# Patient Record
Sex: Female | Born: 1950 | Race: Black or African American | Hispanic: No | Marital: Married | Smoking: Never smoker
Health system: Southern US, Community
[De-identification: ages and names within clinical notes are randomized; demographics above are authoritative.]

## PROBLEM LIST (undated history)

## (undated) DIAGNOSIS — M797 Fibromyalgia: Secondary | ICD-10-CM

## (undated) DIAGNOSIS — D126 Benign neoplasm of colon, unspecified: Secondary | ICD-10-CM

## (undated) DIAGNOSIS — K5909 Other constipation: Secondary | ICD-10-CM

## (undated) DIAGNOSIS — I10 Essential (primary) hypertension: Secondary | ICD-10-CM

## (undated) DIAGNOSIS — I499 Cardiac arrhythmia, unspecified: Secondary | ICD-10-CM

## (undated) DIAGNOSIS — K760 Fatty (change of) liver, not elsewhere classified: Secondary | ICD-10-CM

## (undated) DIAGNOSIS — K219 Gastro-esophageal reflux disease without esophagitis: Secondary | ICD-10-CM

## (undated) DIAGNOSIS — Z8719 Personal history of other diseases of the digestive system: Secondary | ICD-10-CM

## (undated) HISTORY — PX: BREAST SURGERY: SHX581

## (undated) HISTORY — PX: ABDOMINAL HYSTERECTOMY: SHX81

## (undated) HISTORY — PX: OTHER SURGICAL HISTORY: SHX169

---

## 2015-05-04 ENCOUNTER — Other Ambulatory Visit: Payer: Self-pay | Admitting: Gastroenterology

## 2015-05-04 DIAGNOSIS — R109 Unspecified abdominal pain: Secondary | ICD-10-CM

## 2015-05-04 DIAGNOSIS — K5909 Other constipation: Secondary | ICD-10-CM

## 2015-05-04 DIAGNOSIS — G8929 Other chronic pain: Secondary | ICD-10-CM

## 2015-05-10 ENCOUNTER — Ambulatory Visit
Admission: RE | Admit: 2015-05-10 | Discharge: 2015-05-10 | Disposition: A | Discharge status: Home / Self Care | Payer: Medicare Other | Source: Ambulatory Visit | Attending: Gastroenterology | Admitting: Gastroenterology

## 2015-05-10 ENCOUNTER — Other Ambulatory Visit: Payer: Self-pay | Admitting: Gastroenterology

## 2015-05-10 DIAGNOSIS — G8929 Other chronic pain: Secondary | ICD-10-CM

## 2015-05-10 DIAGNOSIS — K5909 Other constipation: Secondary | ICD-10-CM

## 2015-05-10 DIAGNOSIS — R109 Unspecified abdominal pain: Secondary | ICD-10-CM | POA: Diagnosis present

## 2015-05-10 DIAGNOSIS — N281 Cyst of kidney, acquired: Secondary | ICD-10-CM | POA: Insufficient documentation

## 2015-05-10 DIAGNOSIS — G9589 Other specified diseases of spinal cord: Secondary | ICD-10-CM | POA: Insufficient documentation

## 2015-05-10 DIAGNOSIS — N200 Calculus of kidney: Secondary | ICD-10-CM | POA: Diagnosis not present

## 2015-05-10 DIAGNOSIS — Z90710 Acquired absence of both cervix and uterus: Secondary | ICD-10-CM | POA: Insufficient documentation

## 2015-05-10 DIAGNOSIS — K429 Umbilical hernia without obstruction or gangrene: Secondary | ICD-10-CM | POA: Diagnosis not present

## 2015-05-10 DIAGNOSIS — K449 Diaphragmatic hernia without obstruction or gangrene: Secondary | ICD-10-CM | POA: Diagnosis not present

## 2015-05-10 DIAGNOSIS — J9811 Atelectasis: Secondary | ICD-10-CM | POA: Insufficient documentation

## 2015-05-10 HISTORY — DX: Essential (primary) hypertension: I10

## 2015-05-10 LAB — POCT I-STAT CREATININE: Creatinine, Ser: 0.8 mg/dL (ref 0.44–1.00)

## 2015-06-08 DIAGNOSIS — R109 Unspecified abdominal pain: Secondary | ICD-10-CM

## 2015-06-08 DIAGNOSIS — K5909 Other constipation: Secondary | ICD-10-CM | POA: Insufficient documentation

## 2015-06-08 DIAGNOSIS — M797 Fibromyalgia: Secondary | ICD-10-CM | POA: Insufficient documentation

## 2015-06-08 DIAGNOSIS — G8929 Other chronic pain: Secondary | ICD-10-CM | POA: Insufficient documentation

## 2015-09-24 ENCOUNTER — Encounter: Payer: Self-pay | Admitting: *Deleted

## 2015-09-27 ENCOUNTER — Encounter
Admission: RE | Disposition: A | Discharge status: Home / Self Care | Payer: Self-pay | Source: Ambulatory Visit | Attending: Gastroenterology

## 2015-09-27 ENCOUNTER — Ambulatory Visit: Payer: Medicare Other | Admitting: Anesthesiology

## 2015-09-27 ENCOUNTER — Ambulatory Visit
Admission: RE | Admit: 2015-09-27 | Discharge: 2015-09-27 | Disposition: A | Discharge status: Home / Self Care | Payer: Medicare Other | Source: Ambulatory Visit | Attending: Gastroenterology | Admitting: Gastroenterology

## 2015-09-27 ENCOUNTER — Encounter: Payer: Self-pay | Admitting: *Deleted

## 2015-09-27 DIAGNOSIS — R1031 Right lower quadrant pain: Secondary | ICD-10-CM | POA: Insufficient documentation

## 2015-09-27 DIAGNOSIS — D124 Benign neoplasm of descending colon: Secondary | ICD-10-CM | POA: Insufficient documentation

## 2015-09-27 DIAGNOSIS — G8929 Other chronic pain: Secondary | ICD-10-CM | POA: Insufficient documentation

## 2015-09-27 DIAGNOSIS — Z79899 Other long term (current) drug therapy: Secondary | ICD-10-CM | POA: Diagnosis not present

## 2015-09-27 DIAGNOSIS — K219 Gastro-esophageal reflux disease without esophagitis: Secondary | ICD-10-CM | POA: Diagnosis not present

## 2015-09-27 DIAGNOSIS — M797 Fibromyalgia: Secondary | ICD-10-CM | POA: Insufficient documentation

## 2015-09-27 DIAGNOSIS — K5909 Other constipation: Secondary | ICD-10-CM | POA: Diagnosis not present

## 2015-09-27 DIAGNOSIS — I10 Essential (primary) hypertension: Secondary | ICD-10-CM | POA: Diagnosis not present

## 2015-09-27 HISTORY — DX: Fatty (change of) liver, not elsewhere classified: K76.0

## 2015-09-27 HISTORY — DX: Gastro-esophageal reflux disease without esophagitis: K21.9

## 2015-09-27 HISTORY — DX: Personal history of other diseases of the digestive system: Z87.19

## 2015-09-27 HISTORY — DX: Fibromyalgia: M79.7

## 2015-09-27 HISTORY — DX: Other constipation: K59.09

## 2015-09-27 HISTORY — PX: COLONOSCOPY WITH PROPOFOL: SHX5780

## 2015-09-27 SURGERY — COLONOSCOPY WITH PROPOFOL
Anesthesia: General

## 2015-09-27 MED ORDER — MIDAZOLAM HCL 2 MG/2ML IJ SOLN
INTRAMUSCULAR | Status: DC | PRN
  Administered 2015-09-27: 1 mg via INTRAVENOUS

## 2015-09-27 MED ORDER — SODIUM CHLORIDE 0.9 % IV SOLN
INTRAVENOUS | Status: DC
  Administered 2015-09-27: 16:00:00 via INTRAVENOUS

## 2015-09-27 MED ORDER — SODIUM CHLORIDE 0.9 % IV SOLN
INTRAVENOUS | Status: DC
  Administered 2015-09-27: 15:00:00 via INTRAVENOUS

## 2015-09-27 MED ORDER — PROPOFOL 500 MG/50ML IV EMUL
INTRAVENOUS | Status: DC | PRN
  Administered 2015-09-27: 80 ug/kg/min via INTRAVENOUS

## 2015-09-27 MED ORDER — PROPOFOL 10 MG/ML IV BOLUS
INTRAVENOUS | Status: DC | PRN
  Administered 2015-09-27: 20 mg via INTRAVENOUS

## 2015-09-27 MED ORDER — FENTANYL CITRATE (PF) 100 MCG/2ML IJ SOLN
INTRAMUSCULAR | Status: DC | PRN
  Administered 2015-09-27: 50 ug via INTRAVENOUS

## 2015-09-27 NOTE — Anesthesia Preprocedure Evaluation (Signed)
Anesthesia Evaluation  Patient identified by MRN, date of birth, ID band Patient awake    Reviewed: Allergy & Precautions, NPO status , Patient's Chart, lab work & pertinent test results  Airway Mallampati: II  TM Distance: >3 FB Neck ROM: Full    Dental no notable dental hx.    Pulmonary neg pulmonary ROS,    Pulmonary exam normal breath sounds clear to auscultation       Cardiovascular hypertension, Pt. on medications Normal cardiovascular exam     Neuro/Psych negative psych ROS   GI/Hepatic Neg liver ROS, hiatal hernia, GERD  Medicated and Controlled,  Endo/Other  negative endocrine ROS  Renal/GU negative Renal ROS     Musculoskeletal  (+) Fibromyalgia -  Abdominal Normal abdominal exam  (+)   Peds  Hematology   Anesthesia Other Findings   Reproductive/Obstetrics                             Anesthesia Physical Anesthesia Plan  ASA: II  Anesthesia Plan: General   Post-op Pain Management:    Induction: Intravenous  Airway Management Planned: Nasal Cannula  Additional Equipment:   Intra-op Plan:   Post-operative Plan:   Informed Consent: I have reviewed the patients History and Physical, chart, labs and discussed the procedure including the risks, benefits and alternatives for the proposed anesthesia with the patient or authorized representative who has indicated his/her understanding and acceptance.   Dental advisory given  Plan Discussed with: CRNA and Surgeon  Anesthesia Plan Comments:         Anesthesia Quick Evaluation

## 2015-09-27 NOTE — Op Note (Signed)
[facility] Gastroenterology Patient Name: Andrea Yang Procedure Date: 09/27/2015 3:56 PM MRN: [id number] Account #: [id number] Date of Birth: 1951/04/22 Admit Type: Outpatient Age: 65 Room: [facility] ENDO ROOM 4 Gender: Female Note Status: Finalized Procedure:         Colonoscopy Indications:       Abdominal pain in the right lower quadrant Providers:         Lupita Dawn. Candace Cruise, MD Medicines:         Monitored Anesthesia Care Complications:     No immediate complications. Procedure:         Pre-Anesthesia Assessment:                    - Prior to the procedure, a History and Physical was                     performed, and patient medications, allergies and                     sensitivities were reviewed. The patient's tolerance of                     previous anesthesia was reviewed.                    - The risks and benefits of the procedure and the sedation                     options and risks were discussed with the patient. All                     questions were answered and informed consent was obtained.                    - After reviewing the risks and benefits, the patient was                     deemed in satisfactory condition to undergo the procedure.                    After obtaining informed consent, the colonoscope was                     passed under direct vision. Throughout the procedure, the                     patient's blood pressure, pulse, and oxygen saturations                     were monitored continuously. The Colonoscope was                     introduced through the anus and advanced to the the cecum,                     identified by appendiceal orifice and ileocecal valve. The                     colonoscopy was performed without difficulty. The patient                     tolerated the procedure well. The quality of the bowel                     preparation was fair.  Findings:      A diminutive polyp was found in the descending colon. The  polyp was       sessile. The polyp was removed with a jumbo cold forceps. Resection and       retrieval were complete.      The exam was otherwise without abnormality. Impression:        - Preparation of the colon was fair.                    - One diminutive polyp in the descending colon, removed                     with a jumbo cold forceps. Resected and retrieved.                    - The examination was otherwise normal. Recommendation:    - Discharge patient to home.                    - The findings and recommendations were discussed with the                     patient.                    - Await pathology results.                    - Repeat colonoscopy in 5 years for surveillance based on                     pathology results. Procedure Code(s): --- Professional ---                    [id number], Colonoscopy, flexible; with biopsy, single or                     multiple Diagnosis Code(s): --- Professional ---                    D12.4, Benign neoplasm of descending colon                    R10.31, Right lower quadrant pain CPT copyright 2016 [employer]. All rights reserved. The codes documented in this report are preliminary and upon coder review may  be revised to meet current compliance requirements. Hulen Luster, MD 09/27/2015 4:18:18 PM This report has been signed electronically. Number of Addenda: 0 Note Initiated On: 09/27/2015 3:56 PM Scope Withdrawal Time: 0 hours 8 minutes 43 seconds  Total Procedure Duration: 0 hours 14 minutes 46 seconds       [facility]

## 2015-09-27 NOTE — H&P (Signed)
  Date of Initial H&P: 09/14/2015  History reviewed, patient examined, no change in status, stable for surgery.

## 2015-09-27 NOTE — Transfer of Care (Signed)
Immediate Anesthesia Transfer of Care Note  Patient: Andrea Yang  Procedure(s) Performed: Procedure(s): COLONOSCOPY WITH PROPOFOL (N/A)  Patient Location: PACU  Anesthesia Type:General  Level of Consciousness: awake, alert  and oriented  Airway & Oxygen Therapy: Patient Spontanous Breathing and Patient connected to nasal cannula oxygen  Post-op Assessment: Report given to RN and Post -op Vital signs reviewed and stable  Post vital signs: Reviewed and stable  Last Vitals:  Filed Vitals:   09/27/15 1511  BP: 134/74  Pulse: 66  Temp: 36.1 C  Resp: 18    Complications: No apparent anesthesia complications

## 2015-09-29 ENCOUNTER — Encounter: Payer: Self-pay | Admitting: Gastroenterology

## 2015-09-29 LAB — SURGICAL PATHOLOGY

## 2015-09-29 NOTE — Anesthesia Postprocedure Evaluation (Signed)
Anesthesia Post Note  Patient: Andrea Yang  Procedure(s) Performed: Procedure(s) (LRB): COLONOSCOPY WITH PROPOFOL (N/A)  Patient location during evaluation: Endoscopy Anesthesia Type: General Level of consciousness: awake and alert and oriented Pain management: pain level controlled Vital Signs Assessment: post-procedure vital signs reviewed and stable Respiratory status: spontaneous breathing Cardiovascular status: blood pressure returned to baseline Anesthetic complications: no    Last Vitals:  Filed Vitals:   09/27/15 1640 09/27/15 1650  BP: 125/62 142/73  Pulse: 64 67  Temp:    Resp: 14 16    Last Pain:  Filed Vitals:   09/28/15 0725  PainSc: 0-No pain                 Saralyn Willison

## 2015-12-02 DIAGNOSIS — L658 Other specified nonscarring hair loss: Secondary | ICD-10-CM | POA: Insufficient documentation

## 2015-12-02 DIAGNOSIS — L669 Cicatricial alopecia, unspecified: Secondary | ICD-10-CM | POA: Insufficient documentation

## 2016-02-29 ENCOUNTER — Other Ambulatory Visit: Payer: Self-pay | Admitting: Nurse Practitioner

## 2016-02-29 DIAGNOSIS — K760 Fatty (change of) liver, not elsewhere classified: Secondary | ICD-10-CM

## 2016-03-03 ENCOUNTER — Ambulatory Visit
Admission: RE | Admit: 2016-03-03 | Discharge: 2016-03-03 | Disposition: A | Discharge status: Home / Self Care | Payer: Medicare Other | Source: Ambulatory Visit | Attending: Nurse Practitioner | Admitting: Nurse Practitioner

## 2016-03-03 DIAGNOSIS — R932 Abnormal findings on diagnostic imaging of liver and biliary tract: Secondary | ICD-10-CM | POA: Diagnosis not present

## 2016-03-03 DIAGNOSIS — K760 Fatty (change of) liver, not elsewhere classified: Secondary | ICD-10-CM | POA: Diagnosis not present

## 2016-03-03 DIAGNOSIS — N281 Cyst of kidney, acquired: Secondary | ICD-10-CM | POA: Diagnosis not present

## 2017-08-22 ENCOUNTER — Other Ambulatory Visit: Payer: Self-pay | Admitting: Gastroenterology

## 2017-08-22 DIAGNOSIS — IMO0002 Reserved for concepts with insufficient information to code with codable children: Secondary | ICD-10-CM

## 2017-08-27 ENCOUNTER — Ambulatory Visit: Payer: Medicare Other

## 2017-09-04 ENCOUNTER — Ambulatory Visit
Admission: RE | Admit: 2017-09-04 | Discharge: 2017-09-04 | Disposition: A | Discharge status: Home / Self Care | Payer: Medicare Other | Source: Ambulatory Visit | Attending: Gastroenterology | Admitting: Gastroenterology

## 2017-09-04 DIAGNOSIS — R1907 Generalized intra-abdominal and pelvic swelling, mass and lump: Secondary | ICD-10-CM | POA: Insufficient documentation

## 2017-09-04 DIAGNOSIS — IMO0002 Reserved for concepts with insufficient information to code with codable children: Secondary | ICD-10-CM

## 2017-12-10 ENCOUNTER — Ambulatory Visit: Payer: Medicare Other | Attending: Nurse Practitioner | Admitting: Nurse Practitioner

## 2017-12-10 ENCOUNTER — Other Ambulatory Visit
Admission: RE | Admit: 2017-12-10 | Discharge: 2017-12-10 | Disposition: A | Discharge status: Home / Self Care | Payer: Medicare Other | Source: Ambulatory Visit | Attending: Nurse Practitioner | Admitting: Nurse Practitioner

## 2017-12-10 ENCOUNTER — Encounter: Payer: Self-pay | Admitting: Nurse Practitioner

## 2017-12-10 VITALS — BP 155/63 | HR 45 | Temp 98.0°F | Resp 16 | Ht 61.0 in | Wt 175.0 lb

## 2017-12-10 DIAGNOSIS — M7918 Myalgia, other site: Secondary | ICD-10-CM | POA: Diagnosis not present

## 2017-12-10 DIAGNOSIS — M899 Disorder of bone, unspecified: Secondary | ICD-10-CM

## 2017-12-10 DIAGNOSIS — M5441 Lumbago with sciatica, right side: Secondary | ICD-10-CM | POA: Insufficient documentation

## 2017-12-10 DIAGNOSIS — G894 Chronic pain syndrome: Secondary | ICD-10-CM | POA: Diagnosis not present

## 2017-12-10 DIAGNOSIS — M79605 Pain in left leg: Secondary | ICD-10-CM

## 2017-12-10 DIAGNOSIS — K5909 Other constipation: Secondary | ICD-10-CM | POA: Insufficient documentation

## 2017-12-10 DIAGNOSIS — K219 Gastro-esophageal reflux disease without esophagitis: Secondary | ICD-10-CM | POA: Insufficient documentation

## 2017-12-10 DIAGNOSIS — Z789 Other specified health status: Secondary | ICD-10-CM | POA: Diagnosis present

## 2017-12-10 DIAGNOSIS — M79601 Pain in right arm: Secondary | ICD-10-CM | POA: Diagnosis not present

## 2017-12-10 DIAGNOSIS — Z6833 Body mass index (BMI) 33.0-33.9, adult: Secondary | ICD-10-CM | POA: Insufficient documentation

## 2017-12-10 DIAGNOSIS — M79602 Pain in left arm: Secondary | ICD-10-CM

## 2017-12-10 DIAGNOSIS — M533 Sacrococcygeal disorders, not elsewhere classified: Secondary | ICD-10-CM | POA: Diagnosis not present

## 2017-12-10 DIAGNOSIS — M5442 Lumbago with sciatica, left side: Secondary | ICD-10-CM | POA: Insufficient documentation

## 2017-12-10 DIAGNOSIS — M797 Fibromyalgia: Secondary | ICD-10-CM | POA: Diagnosis not present

## 2017-12-10 DIAGNOSIS — Z9889 Other specified postprocedural states: Secondary | ICD-10-CM | POA: Insufficient documentation

## 2017-12-10 DIAGNOSIS — M79604 Pain in right leg: Secondary | ICD-10-CM

## 2017-12-10 DIAGNOSIS — Z79899 Other long term (current) drug therapy: Secondary | ICD-10-CM

## 2017-12-10 DIAGNOSIS — G8929 Other chronic pain: Secondary | ICD-10-CM

## 2017-12-10 DIAGNOSIS — E6609 Other obesity due to excess calories: Secondary | ICD-10-CM

## 2017-12-10 DIAGNOSIS — M542 Cervicalgia: Secondary | ICD-10-CM | POA: Diagnosis not present

## 2017-12-10 DIAGNOSIS — K760 Fatty (change of) liver, not elsewhere classified: Secondary | ICD-10-CM | POA: Insufficient documentation

## 2017-12-10 DIAGNOSIS — Z881 Allergy status to other antibiotic agents status: Secondary | ICD-10-CM | POA: Diagnosis not present

## 2017-12-10 DIAGNOSIS — R109 Unspecified abdominal pain: Secondary | ICD-10-CM | POA: Insufficient documentation

## 2017-12-10 DIAGNOSIS — I129 Hypertensive chronic kidney disease with stage 1 through stage 4 chronic kidney disease, or unspecified chronic kidney disease: Secondary | ICD-10-CM | POA: Insufficient documentation

## 2017-12-10 DIAGNOSIS — K449 Diaphragmatic hernia without obstruction or gangrene: Secondary | ICD-10-CM | POA: Insufficient documentation

## 2017-12-10 DIAGNOSIS — Z888 Allergy status to other drugs, medicaments and biological substances status: Secondary | ICD-10-CM | POA: Insufficient documentation

## 2017-12-10 LAB — VITAMIN B12: Vitamin B-12: 738 pg/mL (ref 180–914)

## 2017-12-10 LAB — C-REACTIVE PROTEIN: CRP: <0.8 mg/dL (ref <1.0)

## 2017-12-10 NOTE — Patient Instructions (Signed)
____________________________________________________________________________________________  Appointment Policy Summary  It is our goal and responsibility to provide the medical community with assistance in the evaluation and management of patients with chronic pain. Unfortunately our resources are limited. Because we do not have an unlimited amount of time, or available appointments, we are required to closely monitor and manage their use. The following rules exist to maximize their use:  Patient's responsibilities: 1. Punctuality:  At what time should I arrive? You should be physically present in our office 30 minutes before your scheduled appointment. Your scheduled appointment is with your assigned healthcare provider. However, it takes 5-10 minutes to be "checked-in", and another 15 minutes for the nurses to do the admission. If you arrive to our office at the time you were given for your appointment, you will end up being at least 20-25 minutes late to your appointment with the provider. 2. Tardiness:  What happens if I arrive only a few minutes after my scheduled appointment time? You will need to reschedule your appointment. The cutoff is your appointment time. This is why it is so important that you arrive at least 30 minutes before that appointment. If you have an appointment scheduled for 10:00 AM and you arrive at 10:01, you will be required to reschedule your appointment.  3. Plan ahead:  Always assume that you will encounter traffic on your way in. Plan for it. If you are dependent on a driver, make sure they understand these rules and the need to arrive early. 4. Other appointments and responsibilities:  Avoid scheduling any other appointments before or after your pain clinic appointments.  5. Be prepared:  Write down everything that you need to discuss with your healthcare provider and give this information to the admitting nurse. Write down the medications that you will need  refilled. Bring your pills and bottles (even the empty ones), to all of your appointments, except for those where a procedure is scheduled. 6. No children or pets:  Find someone to take care of them. It is not appropriate to bring them in. 7. Scheduling changes:  We request "advanced notification" of any changes or cancellations. 8. Advanced notification:  Defined as a time period of more than 24 hours prior to the originally scheduled appointment. This allows for the appointment to be offered to other patients. 9. Rescheduling:  When a visit is rescheduled, it will require the cancellation of the original appointment. For this reason they both fall within the category of "Cancellations".  10. Cancellations:  They require advanced notification. Any cancellation less than 24 hours before the  appointment will be recorded as a "No Show". 11. No Show:  Defined as an unkept appointment where the patient failed to notify or declare to the practice their intention or inability to keep the appointment.  Corrective process for repeat offenders:  1. Tardiness: Three (3) episodes of rescheduling due to late arrivals will be recorded as one (1) "No Show". 2. Cancellation or reschedule: Three (3) cancellations or rescheduling will be recorded as one (1) "No Show". 3. "No Shows": Three (3) "No Shows" within a 12 month period will result in discharge from the practice. ____________________________________________________________________________________________  ____________________________________________________________________________________________  Pain Scale  Introduction: The pain score used by this practice is the Verbal Numerical Rating Scale (VNRS-11). This is an 11-point scale. It is for adults and children 10 years or older. There are significant differences in how the pain score is reported, used, and applied. Forget everything you learned in the past and learn  this scoring system.  General  Information: The scale should reflect your current level of pain. Unless you are specifically asked for the level of your worst pain, or your average pain. If you are asked for one of these two, then it should be understood that it is over the past 24 hours.  Basic Activities of Daily Living (ADL): Personal hygiene, dressing, eating, transferring, and using restroom.  Instructions: Most patients tend to report their level of pain as a combination of two factors, their physical pain and their psychosocial pain. This last one is also known as "suffering" and it is reflection of how physical pain affects you socially and psychologically. From now on, report them separately. From this point on, when asked to report your pain level, report only your physical pain. Use the following table for reference.  Pain Clinic Pain Levels (0-5/10)  Pain Level Score  Description  No Pain 0   Mild pain 1 Nagging, annoying, but does not interfere with basic activities of daily living (ADL). Patients are able to eat, bathe, get dressed, toileting (being able to get on and off the toilet and perform personal hygiene functions), transfer (move in and out of bed or a chair without assistance), and maintain continence (able to control bladder and bowel functions). Blood pressure and heart rate are unaffected. A normal heart rate for a healthy adult ranges from 60 to 100 bpm (beats per minute).   Mild to moderate pain 2 Noticeable and distracting. Impossible to hide from other people. More frequent flare-ups. Still possible to adapt and function close to normal. It can be very annoying and may have occasional stronger flare-ups. With discipline, patients may get used to it and adapt.   Moderate pain 3 Interferes significantly with activities of daily living (ADL). It becomes difficult to feed, bathe, get dressed, get on and off the toilet or to perform personal hygiene functions. Difficult to get in and out of bed or a chair  without assistance. Very distracting. With effort, it can be ignored when deeply involved in activities.   Moderately severe pain 4 Impossible to ignore for more than a few minutes. With effort, patients may still be able to manage work or participate in some social activities. Very difficult to concentrate. Signs of autonomic nervous system discharge are evident: dilated pupils (mydriasis); mild sweating (diaphoresis); sleep interference. Heart rate becomes elevated (>115 bpm). Diastolic blood pressure (lower number) rises above 100 mmHg. Patients find relief in laying down and not moving.   Severe pain 5 Intense and extremely unpleasant. Associated with frowning face and frequent crying. Pain overwhelms the senses.  Ability to do any activity or maintain social relationships becomes significantly limited. Conversation becomes difficult. Pacing back and forth is common, as getting into a comfortable position is nearly impossible. Pain wakes you up from deep sleep. Physical signs will be obvious: pupillary dilation; increased sweating; goosebumps; brisk reflexes; cold, clammy hands and feet; nausea, vomiting or dry heaves; loss of appetite; significant sleep disturbance with inability to fall asleep or to remain asleep. When persistent, significant weight loss is observed due to the complete loss of appetite and sleep deprivation.  Blood pressure and heart rate becomes significantly elevated. Caution: If elevated blood pressure triggers a pounding headache associated with blurred vision, then the patient should immediately seek attention at an urgent or emergency care unit, as these may be signs of an impending stroke.    Emergency Department Pain Levels (6-10/10)  Emergency Room Pain 6 Severely   limiting. Requires emergency care and should not be seen or managed at an outpatient pain management facility. Communication becomes difficult and requires great effort. Assistance to reach the emergency department  may be required. Facial flushing and profuse sweating along with potentially dangerous increases in heart rate and blood pressure will be evident.   Distressing pain 7 Self-care is very difficult. Assistance is required to transport, or use restroom. Assistance to reach the emergency department will be required. Tasks requiring coordination, such as bathing and getting dressed become very difficult.   Disabling pain 8 Self-care is no longer possible. At this level, pain is disabling. The individual is unable to do even the most "basic" activities such as walking, eating, bathing, dressing, transferring to a bed, or toileting. Fine motor skills are lost. It is difficult to think clearly.   Incapacitating pain 9 Pain becomes incapacitating. Thought processing is no longer possible. Difficult to remember your own name. Control of movement and coordination are lost.   The worst pain imaginable 10 At this level, most patients pass out from pain. When this level is reached, collapse of the autonomic nervous system occurs, leading to a sudden drop in blood pressure and heart rate. This in turn results in a temporary and dramatic drop in blood flow to the brain, leading to a loss of consciousness. Fainting is one of the body's self defense mechanisms. Passing out puts the brain in a calmed state and causes it to shut down for a while, in order to begin the healing process.    Summary: 1. Refer to this scale when providing [place] with your pain level. 2. Be accurate and careful when reporting your pain level. This will help with your care. 3. Over-reporting your pain level will lead to loss of credibility. 4. Even a level of 1/10 means that there is pain and will be treated at our facility. 5. High, inaccurate reporting will be documented as "Symptom Exaggeration", leading to loss of credibility and suspicions of possible secondary gains such as obtaining more narcotics, or wanting to appear disabled, for  fraudulent reasons. 6. Only pain levels of 5 or below will be seen at our facility. 7. Pain levels of 6 and above will be sent to the Emergency Department and the appointment cancelled. ____________________________________________________________________________________________   BMI Assessment: Estimated body mass index is 33.07 kg/m as calculated from the following:   Height as of this encounter: 5\' 1"  (1.549 m).   Weight as of this encounter: 175 lb (79.4 kg).  BMI interpretation table: BMI level Category Range association with higher incidence of chronic pain  <18 kg/m2 Underweight   18.5-24.9 kg/m2 Ideal body weight   25-29.9 kg/m2 Overweight Increased incidence by 20%  30-34.9 kg/m2 Obese (Class I) Increased incidence by 68%  35-39.9 kg/m2 Severe obesity (Class II) Increased incidence by 136%  >40 kg/m2 Extreme obesity (Class III) Increased incidence by 254%   BMI Readings from Last 4 Encounters:  12/10/17 33.07 kg/m  09/27/15 30.91 kg/m   Wt Readings from Last 4 Encounters:  12/10/17 175 lb (79.4 kg)

## 2017-12-10 NOTE — Progress Notes (Signed)
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 Because we do not have an unlimited amount of time, or available appointments, we are required to closely monitor and manage their use. The following rules exist to maximize their use:  Patient's responsibilities: 1. Punctuality:  At what time should I arrive? You should be physically present in our office 30 minutes before your scheduled appointment. Your scheduled appointment is with your assigned healthcare provider. However, it takes 5-10 minutes to be "checked-in", and another 15 minutes for the nurses to do the admission. If you arrive to our office at the time you were given for your appointment, you will end up being at least 20-25 minutes late to your appointment with the provider. 2. Tardiness:  What happens if I arrive only a few minutes after my scheduled appointment time? You will need to reschedule your appointment. The cutoff is your appointment time. This is why it is so important that you arrive at least 30 minutes before that appointment. If you have an appointment scheduled for 10:00 AM  and you arrive at 10:01, you will be required to reschedule your appointment.  3. Plan ahead:  Always assume that you will encounter traffic on your way in. Plan for it. If you are dependent on a driver, make sure they understand these rules and the need to arrive early. 4. Other appointments and responsibilities:  Avoid scheduling any other appointments before or after your pain clinic appointments.  5. Be prepared:  Write down everything that you need to discuss with your healthcare provider and give this information to the admitting nurse. Write down the medications that you will need refilled. Bring your pills and bottles (even the empty ones), to all of your appointments, except for those where a procedure is scheduled. 6. No children or pets:  Find someone to take care of them. It is not appropriate to bring them in. 7. Scheduling changes:  We request "advanced notification" of any changes or cancellations. 8. Advanced notification:  Defined as a time period  of more than 24 hours prior to the originally scheduled appointment. This allows for the appointment to be offered to other patients. 9. Rescheduling:  When a visit is rescheduled, it will require the cancellation of the original appointment. For this reason they both fall within the category of "Cancellations".  10. Cancellations:  They require advanced notification. Any cancellation less than 24 hours before the  appointment will be recorded as a "No Show". 11. No Show:  Defined as an unkept appointment where the patient failed to notify or declare to the practice their intention or inability to keep the appointment.  Corrective process for repeat offenders:  1. Tardiness: Three (3) episodes of rescheduling due to late arrivals will be recorded as one (1) "No Show". 2. Cancellation or reschedule: Three (3) cancellations or rescheduling will be recorded as one (1) "No Show". 3. "No Shows": Three (3) "No Shows" within a 12 month  period will result in discharge from the practice. ____________________________________________________________________________________________  ____________________________________________________________________________________________  Pain Scale  Introduction: The pain score used by this practice is the Verbal Numerical Rating Scale (VNRS-11). This is an 11-point scale. It is for adults and children 10 years or older. There are significant differences in how the pain score is reported, used, and applied. Forget everything you learned in the past and learn this scoring system.  General Information: The scale should reflect your current level of pain. Unless you are specifically asked for the level of your worst pain, or your average pain. If you are asked for one of these two, then it should be understood that it is over the past 24 hours.  Basic Activities of Daily Living (ADL): Personal hygiene, dressing, eating, transferring, and using restroom.  Instructions: Most patients tend to report their level of pain as a combination of two factors, their physical pain and their psychosocial pain. This last one is also known as "suffering" and it is reflection of how physical pain affects you socially and psychologically. From now on, report them separately. From this point on, when asked to report your pain level, report only your physical pain. Use the following table for reference.  Pain Clinic Pain Levels (0-5/10)  Pain Level Score  Description  No Pain 0   Mild pain 1 Nagging, annoying, but does not interfere with basic activities of daily living (ADL). Patients are able to eat, bathe, get dressed, toileting (being able to get on and off the toilet and perform personal hygiene functions), transfer (move in and out of bed or a chair without assistance), and maintain continence (able to control bladder and bowel functions). Blood pressure and heart rate are unaffected. A normal heart rate for a  healthy adult ranges from 60 to 100 bpm (beats per minute).   Mild to moderate pain 2 Noticeable and distracting. Impossible to hide from other people. More frequent flare-ups. Still possible to adapt and function close to normal. It can be very annoying and may have occasional stronger flare-ups. With discipline, patients may get used to it and adapt.   Moderate pain 3 Interferes significantly with activities of daily living (ADL). It becomes difficult to feed, bathe, get dressed, get on and off the toilet or to perform personal hygiene functions. Difficult to get in and out of bed or a chair without assistance. Very distracting. With effort, it can be ignored when deeply involved in activities.   Moderately severe pain 4 Impossible to ignore for more than a few minutes. With effort, patients may still be  able to manage work or participate in some social activities. Very difficult to concentrate. Signs of autonomic nervous system discharge are evident: dilated pupils (mydriasis); mild sweating (diaphoresis); sleep interference. Heart rate becomes elevated (>115 bpm). Diastolic blood pressure (lower number) rises above 100 mmHg. Patients find relief in laying down and not moving.   Severe pain 5 Intense and extremely unpleasant. Associated with frowning face and frequent crying. Pain overwhelms the senses.  Ability to do any activity or maintain social relationships becomes significantly limited. Conversation becomes difficult. Pacing back and forth is common, as getting into a comfortable position is nearly impossible. Pain wakes you up from deep sleep. Physical signs will be obvious: pupillary dilation; increased sweating; goosebumps; brisk reflexes; cold, clammy hands and feet; nausea, vomiting or dry heaves; loss of appetite; significant sleep disturbance with inability to fall asleep or to remain asleep. When persistent, significant weight loss is observed due to the complete loss of appetite and sleep  deprivation.  Blood pressure and heart rate becomes significantly elevated. Caution: If elevated blood pressure triggers a pounding headache associated with blurred vision, then the patient should immediately seek attention at an urgent or emergency care unit, as these may be signs of an impending stroke.    Emergency Department Pain Levels (6-10/10)  Emergency Room Pain 6 Severely limiting. Requires emergency care and should not be seen or managed at an outpatient pain management facility. Communication becomes difficult and requires great effort. Assistance to reach the emergency department may be required. Facial flushing and profuse sweating along with potentially dangerous increases in heart rate and blood pressure will be evident.   Distressing pain 7 Self-care is very difficult. Assistance is required to transport, or use restroom. Assistance to reach the emergency department will be required. Tasks requiring coordination, such as bathing and getting dressed become very difficult.   Disabling pain 8 Self-care is no longer possible. At this level, pain is disabling. The individual is unable to do even the most "basic" activities such as walking, eating, bathing, dressing, transferring to a bed, or toileting. Fine motor skills are lost. It is difficult to think clearly.   Incapacitating pain 9 Pain becomes incapacitating. Thought processing is no longer possible. Difficult to remember your own name. Control of movement and coordination are lost.   The worst pain imaginable 10 At this level, most patients pass out from pain. When this level is reached, collapse of the autonomic nervous system occurs, leading to a sudden drop in blood pressure and heart rate. This in turn results in a temporary and dramatic drop in blood flow to the brain, leading to a loss of consciousness. Fainting is one of the body's self defense mechanisms. Passing out puts the brain in a calmed state and causes it to shut down  for a while, in order to begin the healing process.    Summary: 1. Refer to this scale when providing [place] with your pain level. 2. Be accurate and careful when reporting your pain level. This will help with your care. 3. Over-reporting your pain level will lead to loss of credibility. 4. Even a level of 1/10 means that there is pain and will be treated at our facility. 5. High, inaccurate reporting will be documented as "Symptom Exaggeration", leading to loss of credibility and suspicions of possible secondary gains such as obtaining more narcotics, or wanting to appear disabled, for fraudulent reasons. 6. Only pain levels of 5 or below will be seen at our facility. 7. Pain  levels of 6 and above will be sent to the Emergency Department and the appointment cancelled. ____________________________________________________________________________________________   BMI Assessment: Estimated body mass index is 33.07 kg/m as calculated from the following:   Height as of this encounter: 5' 1"  (1.549 m).   Weight as of this encounter: 175 lb (79.4 kg).  BMI interpretation table: BMI level Category Range association with higher incidence of chronic pain  <18 kg/m2 Underweight   18.5-24.9 kg/m2 Ideal body weight   25-29.9 kg/m2 Overweight Increased incidence by 20%  30-34.9 kg/m2 Obese (Class I) Increased incidence by 68%  35-39.9 kg/m2 Severe obesity (Class II) Increased incidence by 136%  >40 kg/m2 Extreme obesity (Class III) Increased incidence by 254%   BMI Readings from Last 4 Encounters:  12/10/17 33.07 kg/m  09/27/15 30.91 kg/m   Wt Readings from Last 4 Encounters:  12/10/17 175 lb (79.4 kg)

## 2017-12-11 ENCOUNTER — Telehealth: Payer: Self-pay | Admitting: Nurse Practitioner

## 2017-12-11 NOTE — Telephone Encounter (Signed)
Patient lives close to [facility], wanted to know if her lab orders could be sent to [facility] in [place] so she would not have to come back to [place]? Melissa may be able to send thru [facility]. Please let patient know if this can be done

## 2017-12-11 NOTE — Telephone Encounter (Signed)
Patient advised she can get labs done at [facility]. She must give them the order that was given to her yesterday and have them fax the results here. Patient confirmed the fax number is on the order.

## 2017-12-15 LAB — COMPLIANCE DRUG ANALYSIS, UR

## 2017-12-17 LAB — COMP. METABOLIC PANEL (12)
AST: 22 IU/L (ref 0–40)
Albumin/Globulin Ratio: 1.4 (ref 1.2–2.2)
Albumin: 4.2 g/dL (ref 3.6–4.8)
Alkaline Phosphatase: 77 IU/L (ref 39–117)
BUN/Creatinine Ratio: 15 (ref 12–28)
BUN: 16 mg/dL (ref 8–27)
Bilirubin Total: 0.3 mg/dL (ref 0.0–1.2)
Calcium: 10.1 mg/dL (ref 8.7–10.3)
Chloride: 100 mmol/L (ref 96–106)
Creatinine, Ser: 1.04 mg/dL — ABNORMAL HIGH (ref 0.57–1.00)
GFR calc Af Amer: 65 mL/min/{1.73_m2} (ref >59)
GFR calc non Af Amer: 56 mL/min/{1.73_m2} — ABNORMAL LOW (ref >59)
Globulin, Total: 3 g/dL (ref 1.5–4.5)
Glucose: 78 mg/dL (ref 65–99)
Potassium: 4.6 mmol/L (ref 3.5–5.2)
Sodium: 140 mmol/L (ref 134–144)
Total Protein: 7.2 g/dL (ref 6.0–8.5)

## 2017-12-17 LAB — SEDIMENTATION RATE: Sed Rate: 39 mm/hr (ref 0–40)

## 2017-12-17 LAB — 25-HYDROXY VITAMIN D LCMS D2+D3
25-Hydroxy, Vitamin D-2: 1.5 ng/mL
25-Hydroxy, Vitamin D-3: 40 ng/mL
25-Hydroxy, Vitamin D: 42 ng/mL

## 2017-12-17 LAB — MAGNESIUM: Magnesium: 2.1 mg/dL (ref 1.6–2.3)

## 2017-12-17 LAB — C-REACTIVE PROTEIN: CRP: 7.6 mg/L — ABNORMAL HIGH (ref 0.0–4.9)

## 2017-12-17 LAB — VITAMIN B12: Vitamin B-12: 1073 pg/mL (ref 232–1245)
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
## 2018-04-29 ENCOUNTER — Encounter: Payer: Self-pay | Admitting: Pain Medicine

## 2018-04-29 ENCOUNTER — Other Ambulatory Visit: Payer: Self-pay

## 2018-04-29 ENCOUNTER — Ambulatory Visit (HOSPITAL_BASED_OUTPATIENT_CLINIC_OR_DEPARTMENT_OTHER): Payer: Medicare Other | Admitting: Pain Medicine

## 2018-04-29 ENCOUNTER — Ambulatory Visit
Admission: RE | Admit: 2018-04-29 | Discharge: 2018-04-29 | Disposition: A | Discharge status: Home / Self Care | Payer: Medicare Other | Source: Ambulatory Visit | Attending: Nurse Practitioner | Admitting: Nurse Practitioner

## 2018-04-29 ENCOUNTER — Ambulatory Visit
Admission: RE | Admit: 2018-04-29 | Discharge: 2018-04-29 | Disposition: A | Discharge status: Home / Self Care | Payer: Medicare Other | Source: Ambulatory Visit | Attending: Pain Medicine | Admitting: Pain Medicine

## 2018-04-29 VITALS — BP 143/74 | HR 47 | Temp 98.4°F | Resp 18 | Ht 61.0 in | Wt 170.0 lb

## 2018-04-29 DIAGNOSIS — M5442 Lumbago with sciatica, left side: Secondary | ICD-10-CM | POA: Diagnosis not present

## 2018-04-29 DIAGNOSIS — M79601 Pain in right arm: Secondary | ICD-10-CM | POA: Diagnosis not present

## 2018-04-29 DIAGNOSIS — R109 Unspecified abdominal pain: Secondary | ICD-10-CM | POA: Diagnosis not present

## 2018-04-29 DIAGNOSIS — M545 Low back pain, unspecified: Secondary | ICD-10-CM | POA: Insufficient documentation

## 2018-04-29 DIAGNOSIS — M79604 Pain in right leg: Secondary | ICD-10-CM

## 2018-04-29 DIAGNOSIS — L658 Other specified nonscarring hair loss: Secondary | ICD-10-CM | POA: Insufficient documentation

## 2018-04-29 DIAGNOSIS — M48061 Spinal stenosis, lumbar region without neurogenic claudication: Secondary | ICD-10-CM | POA: Diagnosis not present

## 2018-04-29 DIAGNOSIS — M79605 Pain in left leg: Secondary | ICD-10-CM

## 2018-04-29 DIAGNOSIS — G894 Chronic pain syndrome: Secondary | ICD-10-CM | POA: Diagnosis not present

## 2018-04-29 DIAGNOSIS — M7918 Myalgia, other site: Secondary | ICD-10-CM | POA: Insufficient documentation

## 2018-04-29 DIAGNOSIS — M542 Cervicalgia: Secondary | ICD-10-CM | POA: Diagnosis present

## 2018-04-29 DIAGNOSIS — M797 Fibromyalgia: Secondary | ICD-10-CM | POA: Diagnosis not present

## 2018-04-29 DIAGNOSIS — Z79899 Other long term (current) drug therapy: Secondary | ICD-10-CM | POA: Diagnosis not present

## 2018-04-29 DIAGNOSIS — M79602 Pain in left arm: Secondary | ICD-10-CM | POA: Insufficient documentation

## 2018-04-29 DIAGNOSIS — M533 Sacrococcygeal disorders, not elsewhere classified: Secondary | ICD-10-CM | POA: Insufficient documentation

## 2018-04-29 DIAGNOSIS — M5441 Lumbago with sciatica, right side: Secondary | ICD-10-CM | POA: Diagnosis not present

## 2018-04-29 DIAGNOSIS — K5909 Other constipation: Secondary | ICD-10-CM | POA: Diagnosis not present

## 2018-04-29 DIAGNOSIS — G8929 Other chronic pain: Secondary | ICD-10-CM

## 2018-04-29 DIAGNOSIS — M4316 Spondylolisthesis, lumbar region: Secondary | ICD-10-CM | POA: Diagnosis not present

## 2018-04-29 DIAGNOSIS — M792 Neuralgia and neuritis, unspecified: Secondary | ICD-10-CM | POA: Insufficient documentation

## 2018-04-29 DIAGNOSIS — R52 Pain, unspecified: Secondary | ICD-10-CM

## 2018-04-29 DIAGNOSIS — G629 Polyneuropathy, unspecified: Secondary | ICD-10-CM | POA: Insufficient documentation

## 2018-04-29 DIAGNOSIS — M1288 Other specific arthropathies, not elsewhere classified, other specified site: Secondary | ICD-10-CM | POA: Diagnosis not present

## 2018-04-29 NOTE — Progress Notes (Signed)
Results were reviewed and found to be: abnormal  No acute injury or pathology identified  Review would suggest interventional pain management techniques may be of benefit

## 2018-04-29 NOTE — Patient Instructions (Signed)

## 2018-04-29 NOTE — Progress Notes (Signed)
Safety precautions to be maintained throughout the outpatient stay will include: orient to surroundings, keep bed in low position, maintain call bell within reach at all times, provide assistance with transfer out of bed and ambulation.  

## 2018-05-05 NOTE — Progress Notes (Signed)
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  Diagnostic bilateral L4-5 transforaminal LESI  Diagnostic right-sided L3-4 LESI  Diagnostic trigger point injections Diagnosticlidocaine infusion   Palliative PRN treatment(s):   None at this time   Provider-requested follow-up: Return for Procedure (w/ sedation): (B) L-FCT BLK #1.  Future Appointments  Date Time Provider [place]  05/16/2018 12:45 PM Milinda Pointer, MD [facility]  None   Primary Care Physician: Renee Rival, NP Location: [facility] Note by: Gaspar Cola, MD Date: 05/06/2018; Time: 4:28 PM

## 2018-05-06 ENCOUNTER — Encounter: Payer: Self-pay | Admitting: Pain Medicine

## 2018-05-06 ENCOUNTER — Other Ambulatory Visit: Payer: Self-pay

## 2018-05-06 ENCOUNTER — Ambulatory Visit: Payer: Medicare Other | Attending: Pain Medicine | Admitting: Pain Medicine

## 2018-05-06 VITALS — BP 150/62 | HR 48 | Temp 98.3°F | Resp 18 | Ht 61.0 in | Wt 172.0 lb

## 2018-05-06 DIAGNOSIS — M79601 Pain in right arm: Secondary | ICD-10-CM

## 2018-05-06 DIAGNOSIS — R109 Unspecified abdominal pain: Secondary | ICD-10-CM | POA: Insufficient documentation

## 2018-05-06 DIAGNOSIS — M47816 Spondylosis without myelopathy or radiculopathy, lumbar region: Secondary | ICD-10-CM | POA: Insufficient documentation

## 2018-05-06 DIAGNOSIS — R7982 Elevated C-reactive protein (CRP): Secondary | ICD-10-CM | POA: Diagnosis not present

## 2018-05-06 DIAGNOSIS — M47896 Other spondylosis, lumbar region: Secondary | ICD-10-CM | POA: Diagnosis not present

## 2018-05-06 DIAGNOSIS — G894 Chronic pain syndrome: Secondary | ICD-10-CM | POA: Diagnosis not present

## 2018-05-06 DIAGNOSIS — G8929 Other chronic pain: Secondary | ICD-10-CM

## 2018-05-06 DIAGNOSIS — M7918 Myalgia, other site: Secondary | ICD-10-CM

## 2018-05-06 DIAGNOSIS — M797 Fibromyalgia: Secondary | ICD-10-CM | POA: Insufficient documentation

## 2018-05-06 DIAGNOSIS — M5136 Other intervertebral disc degeneration, lumbar region: Secondary | ICD-10-CM | POA: Diagnosis not present

## 2018-05-06 DIAGNOSIS — M545 Low back pain: Secondary | ICD-10-CM | POA: Insufficient documentation

## 2018-05-06 DIAGNOSIS — R944 Abnormal results of kidney function studies: Secondary | ICD-10-CM

## 2018-05-06 DIAGNOSIS — M47817 Spondylosis without myelopathy or radiculopathy, lumbosacral region: Secondary | ICD-10-CM | POA: Insufficient documentation

## 2018-05-06 DIAGNOSIS — M5116 Intervertebral disc disorders with radiculopathy, lumbar region: Secondary | ICD-10-CM | POA: Diagnosis not present

## 2018-05-06 DIAGNOSIS — Z888 Allergy status to other drugs, medicaments and biological substances status: Secondary | ICD-10-CM | POA: Insufficient documentation

## 2018-05-06 DIAGNOSIS — Z79899 Other long term (current) drug therapy: Secondary | ICD-10-CM | POA: Insufficient documentation

## 2018-05-06 DIAGNOSIS — M4310 Spondylolisthesis, site unspecified: Secondary | ICD-10-CM | POA: Diagnosis not present

## 2018-05-06 DIAGNOSIS — M9981 Other biomechanical lesions of cervical region: Secondary | ICD-10-CM

## 2018-05-06 DIAGNOSIS — M79602 Pain in left arm: Secondary | ICD-10-CM

## 2018-05-06 DIAGNOSIS — K5909 Other constipation: Secondary | ICD-10-CM | POA: Diagnosis not present

## 2018-05-06 DIAGNOSIS — M4180 Other forms of scoliosis, site unspecified: Secondary | ICD-10-CM | POA: Insufficient documentation

## 2018-05-06 DIAGNOSIS — M4802 Spinal stenosis, cervical region: Secondary | ICD-10-CM | POA: Insufficient documentation

## 2018-05-06 DIAGNOSIS — M79672 Pain in left foot: Secondary | ICD-10-CM | POA: Insufficient documentation

## 2018-05-06 DIAGNOSIS — M542 Cervicalgia: Secondary | ICD-10-CM

## 2018-05-06 DIAGNOSIS — M5010 Cervical disc disorder with radiculopathy, unspecified cervical region: Secondary | ICD-10-CM | POA: Diagnosis not present

## 2018-05-06 DIAGNOSIS — M47812 Spondylosis without myelopathy or radiculopathy, cervical region: Secondary | ICD-10-CM

## 2018-05-06 DIAGNOSIS — M79671 Pain in right foot: Secondary | ICD-10-CM | POA: Diagnosis not present

## 2018-05-06 DIAGNOSIS — M5388 Other specified dorsopathies, sacral and sacrococcygeal region: Secondary | ICD-10-CM | POA: Diagnosis not present

## 2018-05-06 DIAGNOSIS — Z881 Allergy status to other antibiotic agents status: Secondary | ICD-10-CM | POA: Diagnosis not present

## 2018-05-06 DIAGNOSIS — M5442 Lumbago with sciatica, left side: Secondary | ICD-10-CM | POA: Diagnosis not present

## 2018-05-06 DIAGNOSIS — M79605 Pain in left leg: Secondary | ICD-10-CM | POA: Insufficient documentation

## 2018-05-06 DIAGNOSIS — M5441 Lumbago with sciatica, right side: Secondary | ICD-10-CM

## 2018-05-06 DIAGNOSIS — M5412 Radiculopathy, cervical region: Secondary | ICD-10-CM | POA: Insufficient documentation

## 2018-05-06 DIAGNOSIS — M533 Sacrococcygeal disorders, not elsewhere classified: Secondary | ICD-10-CM | POA: Diagnosis not present

## 2018-05-06 DIAGNOSIS — M79604 Pain in right leg: Secondary | ICD-10-CM | POA: Diagnosis present

## 2018-05-06 DIAGNOSIS — M532X6 Spinal instabilities, lumbar region: Secondary | ICD-10-CM | POA: Diagnosis not present

## 2018-05-06 DIAGNOSIS — M5030 Other cervical disc degeneration, unspecified cervical region: Secondary | ICD-10-CM | POA: Insufficient documentation

## 2018-05-06 DIAGNOSIS — M5416 Radiculopathy, lumbar region: Secondary | ICD-10-CM

## 2018-05-06 MED ORDER — TIZANIDINE HCL 2 MG PO TABS: 2.0000 mg | ORAL_TABLET | Freq: Three times a day (TID) | ORAL | 0 refills | Status: DC | PRN

## 2018-05-06 NOTE — Progress Notes (Signed)
Safety precautions to be maintained throughout the outpatient stay will include: orient to surroundings, keep bed in low position, maintain call bell within reach at all times, provide assistance with transfer out of bed and ambulation.  

## 2018-05-06 NOTE — Patient Instructions (Addendum)
_____You have been given pre procedure instructions with sedation.  You have been informed that your Tizanidine is at the pharmacy.  You hav e been informed that he ordered MRI of neck and back and nuerosurgery consult. _______________________________________________________________________________________  Preparing for Procedure with Sedation  Instructions: . Oral Intake: Do not eat or drink anything for at least 8 hours prior to your procedure. . Transportation: Public transportation is not allowed. Bring an adult driver. The driver must be physically present in our waiting room before any procedure can be started. Marland Kitchen Physical Assistance: Bring an adult physically capable of assisting you, in the event you need help. This adult should keep you company at home for at least 6 hours after the procedure. . Blood Pressure Medicine: Take your blood pressure medicine with a sip of water the morning of the procedure. . Blood thinners: Notify our staff if you are taking any blood thinners. Depending on which one you take, there will be specific instructions on how and when to stop it. . Diabetics on insulin: Notify the staff so that you can be scheduled 1st case in the morning. If your diabetes requires high dose insulin, take only  of your normal insulin dose the morning of the procedure and notify the staff that you have done so. . Preventing infections: Shower with an antibacterial soap the morning of your procedure. . Build-up your immune system: Take 1000 mg of Vitamin C with every meal (3 times a day) the day prior to your procedure. Marland Kitchen Antibiotics: Inform the staff if you have a condition or reason that requires you to take antibiotics before dental procedures. . Pregnancy: If you are pregnant, call and cancel the procedure. . Sickness: If you have a cold, fever, or any active infections, call and cancel the procedure. . Arrival: You must be in the facility at least 30 minutes prior to your scheduled  procedure. . Children: Do not bring children with you. . Dress appropriately: Bring dark clothing that you would not mind if they get stained. . Valuables: Do not bring any jewelry or valuables.  Procedure appointments are reserved for interventional treatments only. Marland Kitchen No Prescription Refills. . No medication changes will be discussed during procedure appointments. . No disability issues will be discussed.  Reasons to call and reschedule or cancel your procedure: (Following these recommendations will minimize the risk of a serious complication.) . Surgeries: Avoid having procedures within 2 weeks of any surgery. (Avoid for 2 weeks before or after any surgery). . Flu Shots: Avoid having procedures within 2 weeks of a flu shots or . (Avoid for 2 weeks before or after immunizations). . Barium: Avoid having a procedure within 7-10 days after having had a radiological study involving the use of radiological contrast. (Myelograms, Barium swallow or enema study). . Heart attacks: Avoid any elective procedures or surgeries for the initial 6 months after a "Myocardial Infarction" (Heart Attack). . Blood thinners: It is imperative that you stop these medications before procedures. Let us know if you if you take any blood thinner.  . Infection: Avoid procedures during or within two weeks of an infection (including chest colds or gastrointestinal problems). Symptoms associated with infections include: Localized redness, fever, chills, night sweats or profuse sweating, burning sensation when voiding, cough, congestion, stuffiness, runny nose, sore throat, diarrhea, nausea, vomiting, cold or Flu symptoms, recent or current infections. It is specially important if the infection is over the area that we intend to treat. Marland Kitchen Heart and lung problems: Symptoms  that may suggest an active cardiopulmonary problem include: cough, chest pain, breathing difficulties or shortness of breath, dizziness, ankle swelling,  uncontrolled high or unusually low blood pressure, and/or palpitations. If you are experiencing any of these symptoms, cancel your procedure and contact your primary care physician for an evaluation.  Remember:  Regular Business hours are:  Monday to Thursday 8:00 AM to 4:00 PM  Provider's Schedule: Milinda Pointer, MD:  Procedure days: Tuesday and Thursday 7:30 AM to 4:00 PM  Gillis Santa, MD:  Procedure days: Monday and Wednesday 7:30 AM to 4:00 PM ____________________________________________________________________________________________   ____________________________________________________________________________________________  Muscle Spasms & Cramps  Cause:  The most common cause of muscle spasms and cramps is vitamin and/or electrolyte (calcium, potassium, sodium, etc.) deficiencies.  Possible triggers: Sweating - causes loss of electrolytes thru the skin. Steroids - causes loss of electrolytes thru the urine.  Treatment: 1. Gatorade (or any other electrolyte-replenishing drink) - Take 1, 8 oz glass with each meal (3 times a day). 2. OTC (over-the-counter) Magnesium 400 to 500 mg - Take 1 tablet twice a day (one with breakfast and one before bedtime). If you have kidney problems, talk to your primary care physician before taking any Magnesium. 3. Tonic Water with quinine - Take 1, 8 oz glass before bedtime.   ____________________________________________________________________________________________   Preparing for Procedure with Sedation Instructions: . Oral Intake: Do not eat or drink anything for at least 8 hours prior to your procedure. . Transportation: Public transportation is not allowed. Bring an adult driver. The driver must be physically present in our waiting room before any procedure can be started. Marland Kitchen Physical Assistance: Bring an adult capable of physically assisting you, in the event you need help. . Blood Pressure Medicine: Take your blood pressure  medicine with a sip of water the morning of the procedure. . Insulin: Take only  of your normal insulin dose. . Preventing infections: Shower with an antibacterial soap the morning of your procedure. . Build-up your immune system: Take 1000 mg of Vitamin C with every meal (3 times a day) the day prior to your procedure. . Pregnancy: If you are pregnant, call and cancel the procedure. . Sickness: If you have a cold, fever, or any active infections, call and cancel the procedure. . Arrival: You must be in the facility at least 30 minutes prior to your scheduled procedure. . Children: Do not bring children with you. . Dress appropriately: Bring dark clothing that you would not mind if they get stained. . Valuables: Do not bring any jewelry or valuables. Procedure appointments are reserved for interventional treatments only. Marland Kitchen No Prescription Refills. . No medication changes will be discussed during procedure appointments. . No disability issues will be discussed.

## 2018-05-16 ENCOUNTER — Ambulatory Visit (HOSPITAL_BASED_OUTPATIENT_CLINIC_OR_DEPARTMENT_OTHER): Payer: Medicare Other | Admitting: Pain Medicine

## 2018-05-16 ENCOUNTER — Ambulatory Visit
Admission: RE | Admit: 2018-05-16 | Discharge: 2018-05-16 | Disposition: A | Discharge status: Home / Self Care | Payer: Medicare Other | Source: Ambulatory Visit | Attending: Pain Medicine | Admitting: Pain Medicine

## 2018-05-16 ENCOUNTER — Encounter: Payer: Self-pay | Admitting: Pain Medicine

## 2018-05-16 ENCOUNTER — Other Ambulatory Visit: Payer: Self-pay

## 2018-05-16 VITALS — BP 144/90 | HR 49 | Temp 96.6°F | Resp 16 | Ht 61.0 in | Wt 170.0 lb

## 2018-05-16 DIAGNOSIS — G8929 Other chronic pain: Secondary | ICD-10-CM

## 2018-05-16 DIAGNOSIS — M545 Low back pain, unspecified: Secondary | ICD-10-CM

## 2018-05-16 DIAGNOSIS — M4310 Spondylolisthesis, site unspecified: Secondary | ICD-10-CM

## 2018-05-16 DIAGNOSIS — M5441 Lumbago with sciatica, right side: Secondary | ICD-10-CM

## 2018-05-16 DIAGNOSIS — M47817 Spondylosis without myelopathy or radiculopathy, lumbosacral region: Secondary | ICD-10-CM | POA: Diagnosis not present

## 2018-05-16 DIAGNOSIS — M47816 Spondylosis without myelopathy or radiculopathy, lumbar region: Secondary | ICD-10-CM

## 2018-05-16 DIAGNOSIS — M5442 Lumbago with sciatica, left side: Secondary | ICD-10-CM

## 2018-05-16 MED ORDER — TRIAMCINOLONE ACETONIDE 40 MG/ML IJ SUSP: 80.0000 mg | Freq: Once | INTRAMUSCULAR | Status: DC

## 2018-05-16 MED ORDER — FENTANYL CITRATE (PF) 100 MCG/2ML IJ SOLN
25.0000 ug | INTRAMUSCULAR | Status: DC | PRN
  Administered 2018-05-16: 50 ug via INTRAVENOUS
  Filled 2018-05-16: qty 2

## 2018-05-16 MED ORDER — LIDOCAINE HCL 2 % IJ SOLN
20.0000 mL | Freq: Once | INTRAMUSCULAR | Status: AC
  Administered 2018-05-16: 400 mg
  Filled 2018-05-16: qty 40

## 2018-05-16 MED ORDER — GLYCOPYRROLATE 0.2 MG/ML IJ SOLN
INTRAMUSCULAR | Status: AC
  Filled 2018-05-16: qty 1

## 2018-05-16 MED ORDER — DEXAMETHASONE SODIUM PHOSPHATE 10 MG/ML IJ SOLN
10.0000 mg | Freq: Once | INTRAMUSCULAR | Status: AC
  Administered 2018-05-16: 10 mg
  Filled 2018-05-16: qty 1

## 2018-05-16 MED ORDER — DEXAMETHASONE SODIUM PHOSPHATE 10 MG/ML IJ SOLN
20.0000 mg | Freq: Once | INTRAMUSCULAR | Status: AC
  Administered 2018-05-16: 10 mg
  Filled 2018-05-16: qty 2

## 2018-05-16 MED ORDER — LACTATED RINGERS IV SOLN: 1000.0000 mL | Freq: Once | INTRAVENOUS | Status: DC

## 2018-05-16 MED ORDER — ROPIVACAINE HCL 2 MG/ML IJ SOLN
18.0000 mL | Freq: Once | INTRAMUSCULAR | Status: AC
  Administered 2018-05-16: 18 mL via PERINEURAL
  Filled 2018-05-16: qty 20

## 2018-05-16 MED ORDER — MIDAZOLAM HCL 5 MG/5ML IJ SOLN
1.0000 mg | INTRAMUSCULAR | Status: DC | PRN
  Administered 2018-05-16: 2 mg via INTRAVENOUS
  Filled 2018-05-16: qty 5

## 2018-05-16 NOTE — Patient Instructions (Signed)

## 2018-05-16 NOTE — Progress Notes (Signed)
Patient's Name: Andrea Yang  MRN: [id number]  Referring Provider: Milinda Pointer, MD  DOB: 1950-09-03  PCP: Renee Rival, NP  DOS: 05/16/2018  Note by: Gaspar Cola, MD  Service setting: Ambulatory outpatient  Specialty: Interventional Pain Management  Patient type: Established  Location: [facility] (AMB) Pain Management Facility  Visit type: Interventional Procedure   Primary Reason for Visit: Interventional Pain Management Treatment. CC: Back Pain  Procedure:          Anesthesia, Analgesia, Anxiolysis:  Type: Lumbar Facet, Medial Branch Block(s) #1  Primary Purpose: Diagnostic Region: Posterolateral Lumbosacral Spine Level: L2, L3, L4, L5, & S1 Medial Branch Level(s). Injecting these levels blocks the L3-4, L4-5, and L5-S1 lumbar facet joints. Laterality: Bilateral  Type: Moderate (Conscious) Sedation combined with Local Anesthesia Indication(s): Analgesia and Anxiety Route: Intravenous (IV) IV Access: Secured Sedation: Meaningful verbal contact was maintained at all times during the procedure  Local Anesthetic: Lidocaine 1-2%  Position: Prone   Indications: 1. Spondylosis without myelopathy or radiculopathy, lumbosacral region   2. Lumbar facet syndrome (Bilateral) (R>L)   3. 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 Chronic low back pain (Primary Area of Pain) (Bilateral) (R>L)    Pain Score: Pre-procedure: 7 /10 Post-procedure: 0-No pain/10  Pre-op Assessment:  Andrea Yang is a 66 y.o. (year old), female patient, seen today for interventional treatment. She  has a past surgical history that includes Colonoscopy with propofol (N/A, 09/27/2015). Andrea Yang has a current medication list which includes the following prescription(s): albuterol, amlodipine, calcium carbonate, cetirizine, cholecalciferol, diphenhydramine, evening primrose oil, fluconazole, fluticasone, gabapentin, multivitamin, polyethylene glycol, sucralfate, tizanidine, and  cyclobenzaprine, and the following Facility-Administered Medications: fentanyl, lactated ringers, and midazolam. Her primarily concern today is the Back Pain  Initial Vital Signs:  Pulse/HCG Rate: (!) 47ECG Heart Rate: (!) 56 Temp: 97.8 F (36.6 C) Resp: 16 BP: (!) 160/71 SpO2: 100 %  BMI: Estimated body mass index is 32.12 kg/m as calculated from the following:   Height as of this encounter: 5\' 1"  (1.549 m).   Weight as of this encounter: 170 lb (77.1 kg).  Risk Assessment: Allergies: Reviewed. She is allergic to dicyclomine; minoxidil; triamcinolone acetonide; valacyclovir; amantadines; cymbalta [duloxetine hcl]; erythromycin; hydrochlorothiazide; iodine; meloxicam; nexium [esomeprazole magnesium]; prilosec [omeprazole]; savella [milnacipran hcl]; cleocin [clindamycin hcl]; fluocinolone acetonide; and tramadol.  Allergy Precautions: None required Coagulopathies: Reviewed. None identified.  Blood-thinner therapy: None at this time Active Infection(s): Reviewed. None identified. Andrea Yang is afebrile  Site Confirmation: Andrea Yang was asked to confirm the procedure and laterality before marking the site Procedure checklist: Completed Consent: Before the procedure and under the influence of no sedative(s), amnesic(s), or anxiolytics, the patient was informed of the treatment options, risks and possible complications. To fulfill our ethical and legal obligations, as recommended by the American Medical Association's Code of Ethics, I have informed the patient of my clinical impression; the nature and purpose of the treatment or procedure; the risks, benefits, and possible complications of the intervention; the alternatives, including doing nothing; the risk(s) and benefit(s) of the alternative treatment(s) or procedure(s); and the risk(s) and benefit(s) of doing nothing. The patient was provided information about the general risks and possible complications associated with the procedure. These  may include, but are not limited to: failure to achieve desired goals, infection, bleeding, organ or nerve damage, allergic reactions, paralysis, and death. In addition, the patient was informed of those risks and complications associated to Spine-related procedures, such as failure  to decrease pain; infection (i.e.: Meningitis, epidural or intraspinal abscess); bleeding (i.e.: epidural hematoma, subarachnoid hemorrhage, or any other type of intraspinal or peri-dural bleeding); organ or nerve damage (i.e.: Any type of peripheral nerve, nerve root, or spinal cord injury) with subsequent damage to sensory, motor, and/or autonomic systems, resulting in permanent pain, numbness, and/or weakness of one or several areas of the body; allergic reactions; (i.e.: anaphylactic reaction); and/or death. Furthermore, the patient was informed of those risks and complications associated with the medications. These include, but are not limited to: allergic reactions (i.e.: anaphylactic or anaphylactoid reaction(s)); adrenal axis suppression; blood sugar elevation that in diabetics may result in ketoacidosis or comma; water retention that in patients with history of congestive heart failure may result in shortness of breath, pulmonary edema, and decompensation with resultant heart failure; weight gain; swelling or edema; medication-induced neural toxicity; particulate matter embolism and blood vessel occlusion with resultant organ, and/or nervous system infarction; and/or aseptic necrosis of one or more joints. Finally, the patient was informed that Medicine is not an exact science; therefore, there is also the possibility of unforeseen or unpredictable risks and/or possible complications that may result in a catastrophic outcome. The patient indicated having understood very clearly. We have given the patient no guarantees and we have made no promises. Enough time was given to the patient to ask questions, all of which were  answered to the patient's satisfaction. Andrea Yang has indicated that she wanted to continue with the procedure. Attestation: I, the ordering provider, attest that I have discussed with the patient the benefits, risks, side-effects, alternatives, likelihood of achieving goals, and potential problems during recovery for the procedure that I have provided informed consent. Date  Time: 05/16/2018  1:03 PM  Pre-Procedure Preparation:  Monitoring: As per clinic protocol. Respiration, ETCO2, SpO2, BP, heart rate and rhythm monitor placed and checked for adequate function Safety Precautions: Patient was assessed for positional comfort and pressure points before starting the procedure. Time-out: I initiated and conducted the "Time-out" before starting the procedure, as per protocol. The patient was asked to participate by confirming the accuracy of the "Time Out" information. Verification of the correct person, site, and procedure were performed and confirmed by me, the nursing staff, and the patient. "Time-out" conducted as per Joint Commission's Universal Protocol (UP.01.01.01). Time: 1349  Description of Procedure:          Laterality: Bilateral. The procedure was performed in identical fashion on both sides. Levels:  L2, L3, L4, L5, & S1 Medial Branch Level(s) Area Prepped: Posterior Lumbosacral Region Prepping solution: ChloraPrep (2% chlorhexidine gluconate and 70% isopropyl alcohol) Safety Precautions: Aspiration looking for blood return was conducted prior to all injections. At no point did we inject any substances, as a needle was being advanced. Before injecting, the patient was told to immediately notify me if she was experiencing any new onset of "ringing in the ears, or metallic taste in the mouth". No attempts were made at seeking any paresthesias. Safe injection practices and needle disposal techniques used. Medications properly checked for expiration dates. SDV (single dose vial) medications  used. After the completion of the procedure, all disposable equipment used was discarded in the proper designated medical waste containers. Local Anesthesia: Protocol guidelines were followed. The patient was positioned over the fluoroscopy table. The area was prepped in the usual manner. The time-out was completed. The target area was identified using fluoroscopy. A 12-in long, straight, sterile hemostat was used with fluoroscopic guidance to locate the targets for  each level blocked. Once located, the skin was marked with an approved surgical skin marker. Once all sites were marked, the skin (epidermis, dermis, and hypodermis), as well as deeper tissues (fat, connective tissue and muscle) were infiltrated with a small amount of a short-acting local anesthetic, loaded on a 10cc syringe with a 25G, 1.5-in  Needle. An appropriate amount of time was allowed for local anesthetics to take effect before proceeding to the next step. Local Anesthetic: Lidocaine 2.0% The unused portion of the local anesthetic was discarded in the proper designated containers. Technical explanation of process:  L2 Medial Branch Nerve Block (MBB): The target area for the L2 medial branch is at the junction of the postero-lateral aspect of the superior articular process and the superior, posterior, and medial edge of the transverse process of L3. Under fluoroscopic guidance, a Quincke needle was inserted until contact was made with os over the superior postero-lateral aspect of the pedicular shadow (target area). After negative aspiration for blood, 0.5 mL of the nerve block solution was injected without difficulty or complication. The needle was removed intact. L3 Medial Branch Nerve Block (MBB): The target area for the L3 medial branch is at the junction of the postero-lateral aspect of the superior articular process and the superior, posterior, and medial edge of the transverse process of L4. Under fluoroscopic guidance, a Quincke  needle was inserted until contact was made with os over the superior postero-lateral aspect of the pedicular shadow (target area). After negative aspiration for blood, 0.5 mL of the nerve block solution was injected without difficulty or complication. The needle was removed intact. L4 Medial Branch Nerve Block (MBB): The target area for the L4 medial branch is at the junction of the postero-lateral aspect of the superior articular process and the superior, posterior, and medial edge of the transverse process of L5. Under fluoroscopic guidance, a Quincke needle was inserted until contact was made with os over the superior postero-lateral aspect of the pedicular shadow (target area). After negative aspiration for blood, 0.5 mL of the nerve block solution was injected without difficulty or complication. The needle was removed intact. L5 Medial Branch Nerve Block (MBB): The target area for the L5 medial branch is at the junction of the postero-lateral aspect of the superior articular process and the superior, posterior, and medial edge of the sacral ala. Under fluoroscopic guidance, a Quincke needle was inserted until contact was made with os over the superior postero-lateral aspect of the pedicular shadow (target area). After negative aspiration for blood, 0.5 mL of the nerve block solution was injected without difficulty or complication. The needle was removed intact. S1 Medial Branch Nerve Block (MBB): The target area for the S1 medial branch is at the posterior and inferior 6 o'clock position of the L5-S1 facet joint. Under fluoroscopic guidance, the Quincke needle inserted for the L5 MBB was redirected until contact was made with os over the inferior and postero aspect of the sacrum, at the 6 o' clock position under the L5-S1 facet joint (Target area). After negative aspiration for blood, 0.5 mL of the nerve block solution was injected without difficulty or complication. The needle was removed  intact. Procedural Needles: 22-gauge, 3.5-inch, Quincke needles used for all levels. Nerve block solution: 0.2% PF-Ropivacaine + Decadron 10 mg/mL (2 mL)     The unused portion of the solution was discarded in the proper designated containers.  Once the entire procedure was completed, the treated area was cleaned, making sure to leave some of  the prepping solution back to take advantage of its long term bactericidal properties.   Illustration of the posterior view of the lumbar spine and the posterior neural structures. Laminae of L2 through S1 are labeled. DPRL5, dorsal primary ramus of L5; DPRS1, dorsal primary ramus of S1; DPR3, dorsal primary ramus of L3; FJ, facet (zygapophyseal) joint L3-L4; I, inferior articular process of L4; LB1, lateral branch of dorsal primary ramus of L1; IAB, inferior articular branches from L3 medial branch (supplies L4-L5 facet joint); IBP, intermediate branch plexus; MB3, medial branch of dorsal primary ramus of L3; NR3, third lumbar nerve root; S, superior articular process of L5; SAB, superior articular branches from L4 (supplies L4-5 facet joint also); TP3, transverse process of L3.  Vitals:   05/16/18 1405 05/16/18 1415 05/16/18 1426 05/16/18 1435  BP: 126/69 (!) 148/77 (!) 150/80 (!) 144/90  Pulse: (!) 49     Resp: 13 (!) 25 20 16   Temp:  98.7 F (37.1 C)  (!) 96.6 F (35.9 C)  SpO2: 100% 100% 98% 100%  Weight:      Height:         Start Time: 1349 hrs. End Time: 1404 hrs.  Imaging Guidance (Spinal):          Type of Imaging Technique: Fluoroscopy Guidance (Spinal) Indication(s): Assistance in needle guidance and placement for procedures requiring needle placement in or near specific anatomical locations not easily accessible without such assistance. Exposure Time: Please see nurses notes. Contrast: None used. Fluoroscopic Guidance: I was personally present during the use of fluoroscopy. "Tunnel Vision Technique" used to obtain the best possible  view of the target area. Parallax error corrected before commencing the procedure. "Direction-depth-direction" technique used to introduce the needle under continuous pulsed fluoroscopy. Once target was reached, antero-posterior, oblique, and lateral fluoroscopic projection used confirm needle placement in all planes. Images permanently stored in EMR. Interpretation: No contrast injected. I personally interpreted the imaging intraoperatively. Adequate needle placement confirmed in multiple planes. Permanent images saved into the patient's record.  Antibiotic Prophylaxis:   Anti-infectives (From admission, onward)   None     Indication(s): None identified  Post-operative Assessment:  Post-procedure Vital Signs:  Pulse/HCG Rate: (!) 4989 Temp: (!) 96.6 F (35.9 C) Resp: 16 BP: (!) 144/90 SpO2: 100 %  EBL: None  Complications: No immediate post-treatment complications observed by team, or reported by patient.  Note: The patient tolerated the entire procedure well. A repeat set of vitals were taken after the procedure and the patient was kept under observation following institutional policy, for this type of procedure. Post-procedural neurological assessment was performed, showing return to baseline, prior to discharge. The patient was provided with post-procedure discharge instructions, including a section on how to identify potential problems. Should any problems arise concerning this procedure, the patient was given instructions to immediately contact us, at any time, without hesitation. In any case, we plan to contact the patient by telephone for a follow-up status report regarding this interventional procedure.  Comments:  No additional relevant information.  Plan of Care    Imaging Orders     DG C-Arm 1-60 Min-No Report  Procedure Orders     LUMBAR FACET(MEDIAL BRANCH NERVE BLOCK) MBNB  Medications ordered for procedure: Meds ordered this encounter  Medications  . lidocaine  (XYLOCAINE) 2 % (with pres) injection 400 mg  . midazolam (VERSED) 5 MG/5ML injection 1-2 mg    Make sure Flumazenil is available in the pyxis when using this medication. If oversedation occurs,  administer 0.2 mg IV over 15 sec. If after 45 sec no response, administer 0.2 mg again over 1 min; may repeat at 1 min intervals; not to exceed 4 doses (1 mg)  . fentaNYL (SUBLIMAZE) injection 25-50 mcg    Make sure Narcan is available in the pyxis when using this medication. In the event of respiratory depression (RR< 8/min): Titrate NARCAN (naloxone) in increments of 0.1 to 0.2 mg IV at 2-3 minute intervals, until desired degree of reversal.  . lactated ringers infusion 1,000 mL  . ropivacaine (PF) 2 mg/mL (0.2%) (NAROPIN) injection 18 mL  . DISCONTD: triamcinolone acetonide (KENALOG-40) injection 80 mg  . dexamethasone (DECADRON) injection 20 mg  . dexamethasone (DECADRON) injection 10 mg   Medications administered: We administered lidocaine, midazolam, fentaNYL, ropivacaine (PF) 2 mg/mL (0.2%), dexamethasone, and dexamethasone.  See the medical record for exact dosing, route, and time of administration.  New Prescriptions   No medications on file   Disposition: Discharge home  Discharge Date & Time: 05/16/2018; 1437 hrs.   Physician-requested Follow-up: Return for post-procedure eval (2 wks), w/ Dr. Dossie Arbour.  Future Appointments  Date Time Provider [place]  06/03/2018  9:15 AM Milinda Pointer, MD [facility] None   Primary Care Physician: Renee Rival, NP Location: [facility] Note by: Gaspar Cola, MD Date: 05/16/2018; Time: 2:39 PM  Disclaimer:  Medicine is not an exact science. The only guarantee in medicine is that nothing is guaranteed. It is important to note that the decision to proceed with this intervention was based on the information collected from the patient. The Data and conclusions were drawn from the patient's  questionnaire, the interview, and the physical examination. Because the information was provided in large part by the patient, it cannot be guaranteed that it has not been purposely or unconsciously manipulated. Every effort has been made to obtain as much relevant data as possible for this evaluation. It is important to note that the conclusions that lead to this procedure are derived in large part from the available data. Always take into account that the treatment will also be dependent on availability of resources and existing treatment guidelines, considered by other Pain Management Practitioners as being common knowledge and practice, at the time of the intervention. For Medico-Legal purposes, it is also important to point out that variation in procedural techniques and pharmacological choices are the acceptable norm. The indications, contraindications, technique, and results of the above procedure should only be interpreted and judged by a Board-Certified Interventional Pain Specialist with extensive familiarity and expertise in the same exact procedure and technique.

## 2018-05-17 ENCOUNTER — Telehealth: Payer: Self-pay | Admitting: *Deleted

## 2018-05-17 NOTE — Telephone Encounter (Signed)
Callback re; procedure.  Voicemail left to call office if there are questions or concerns.

## 2018-05-17 NOTE — Telephone Encounter (Signed)
Callback re; procedure.  Voicemail left to call our office if there are questions or concerns.

## 2018-05-22 ENCOUNTER — Encounter: Payer: Self-pay | Admitting: Pain Medicine

## 2018-05-22 DIAGNOSIS — M48061 Spinal stenosis, lumbar region without neurogenic claudication: Secondary | ICD-10-CM | POA: Insufficient documentation

## 2018-05-22 DIAGNOSIS — M47816 Spondylosis without myelopathy or radiculopathy, lumbar region: Secondary | ICD-10-CM | POA: Insufficient documentation

## 2018-05-30 NOTE — Progress Notes (Signed)
Patient's Name: Andrea Yang  MRN: [id number]  Referring Provider: Renee Rival, NP  DOB: 1951/05/23  PCP: Renee Rival, NP  DOS: 06/03/2018  Note by: Gaspar Cola, MD  Service setting: Ambulatory outpatient  Specialty: Interventional Pain Management  Location: [facility] (AMB) Pain Management Facility    Patient type: Established   Primary Reason(s) for Visit: Encounter for post-procedure evaluation of chronic illness with mild to moderate exacerbation CC: Back Pain (low); Leg Pain (bilateral to feet); Pain (head); and Neck Pain  HPI  Andrea Yang is a 67 y.o. year old, female patient, who comes today for a post-procedure evaluation. She has Central centrifugal scarring alopecia; Chronic abdominal pain; Chronic constipation; Female pattern hair loss; Fibromyalgia syndrome; Chronic low back pain (Primary Area of Pain) (Bilateral) (R>L) w/ sciatica (Bilateral); Chronic lower extremity pain (Secondary Area of Pain) (Bilateral) (R>L); Chronic neck pain (Tertiary Area of Pain) (Bilateral) (R>L); Chronic pain syndrome; Pharmacologic therapy; Disorder of skeletal system; Problems influencing health status; Chronic sacroiliac joint pain (Right); Chronic upper extremity pain (Fourth Area of Pain) (Bilateral) (R>L); Chronic low back pain (Primary Area of Pain) (Bilateral) (R>L); Chronic generalized pain; Chronic musculoskeletal pain; Neurogenic pain; Cervicalgia; Chronic sacroiliac joint sclerosis (Right); Elevated C-reactive protein (CRP); Osteoarthritis of lumbar spine; 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, L5-S1) (Bilateral); Lumbar facet syndrome (Bilateral) (R>L); Spondylosis without myelopathy or radiculopathy, lumbosacral region; Other specified dorsopathies, sacral and sacrococcygeal region; Lumbar Levoscoliosis; 4 mm Anterolisthesis of L4 over L5 on flexion; Lumbar spine instability (L4 over L5) (4 mm displacement on flexion); DDD (degenerative disc disease), lumbar; DDD  (degenerative disc disease), cervical; Cervical facet hypertrophy (Bilateral); 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 Her primarily concern today is the Back Pain (low); Leg Pain (bilateral to feet); Pain (head); and Neck Pain  Pain Assessment: Location: Lower Back Radiating: legs Onset: More than a month ago Duration: Chronic pain Quality: Aching, Burning, Constant, Throbbing, Shooting Severity: 7 /10 (subjective, self-reported pain score)  Note: Reported level is compatible with observation.                         When using our objective Pain Scale, levels between 6 and 10/10 are said to belong in an emergency room, as it progressively worsens from a 6/10, described as severely limiting, requiring emergency care not usually available at an outpatient pain management facility. At a 6/10 level, communication becomes difficult and requires great effort. Assistance to reach the emergency department may be required. Facial flushing and profuse sweating along with potentially dangerous increases in heart rate and blood pressure will be evident. Effect on ADL: limits activity Timing: Constant Modifying factors: ice, medications, hot baths BP: (!) 146/64  HR: (!) 53  Andrea Yang comes in today for post-procedure evaluation.  Further details on both, my assessment(s), as well as the proposed treatment plan, please see below.  Post-Procedure Assessment  05/16/2018 Procedure: Diagnostic bilateral lumbar facet nerve block #1 under fluoroscopic guidance and IV sedation Pre-procedure pain  score:  7/10 Post-procedure pain score: 0/10 (100% relief) Influential Factors: BMI: 32.12 kg/m Intra-procedural challenges: None observed.         Assessment challenges: None detected.  Reported side-effects: None.        Post-procedural adverse reactions or complications: None reported         Sedation: Sedation provided. When no sedatives are used, the analgesic levels obtained are directly associated to the effectiveness of the local anesthetics. However, when sedation is provided, the level of analgesia obtained during the initial 1 hour following the intervention, is believed to be the result of a combination of factors. These factors may include, but are not limited to: 1. The effectiveness of the local anesthetics used. 2. The effects of the analgesic(s) and/or anxiolytic(s) used. 3. The degree of discomfort experienced by the patient at the time of the procedure. 4. The patients ability and reliability in recalling and recording the events. 5. The presence and influence of possible secondary gains and/or psychosocial factors. Reported result: Relief experienced during the 1st hour after the procedure: 100 % (Ultra-Short Term Relief)            Interpretative annotation: Clinically appropriate result. Analgesia during this period is likely to be Local Anesthetic and/or IV Sedative (Analgesic/Anxiolytic) related.          Effects of local anesthetic: The analgesic effects attained during this period are directly associated to the localized infiltration of local anesthetics and therefore cary significant diagnostic value as to the etiological location, or anatomical origin, of the pain. Expected duration of relief is directly dependent on the pharmacodynamics of the local anesthetic used. Long-acting (4-6 hours) anesthetics used.  Reported result: Relief during the next 4 to 6 hour after the procedure: 0 % (Short-Term Relief)            Interpretative annotation: Clinically  possible results. No analgesic effect would suggest pain etiology to reside elsewhere.          Long-term benefit: Defined as the period of time past the expected duration of local anesthetics (1 hour for short-acting and 4-6 hours for long-acting). With the possible exception of prolonged sympathetic blockade from the local anesthetics, benefits during this period are typically attributed to, or associated with, other factors such as analgesic sensory neuropraxia, antiinflammatory effects, or beneficial biochemical changes provided by agents other than the local anesthetics.  Reported result: Extended relief following procedure: 0 % (Long-Term Relief)            Interpretative annotation: Clinically possible results. No benefit. Therapeutic failure. Persistent algesic mechanism detected.          Current benefits: Defined as reported results that persistent at this point in time.   Analgesia: 0 %            Function: Back to baseline ROM: Back to baseline Interpretative annotation: No benefit. Therapeutic failure. Results would argue against repeating therapy.          Interpretation: Results would suggest a successful diagnostic intervention.                  Plan:  Re-assessment of algesic etiology. Today I spent a great deal of time going over the results of this diagnostic injection, just to make sure that she was reporting things correctly.  She still insists that she obtained no benefit whatsoever for the duration of the local anesthetic.  This would suggest that the etiology of the pain resides elsewhere.  In addition, she did not indicate any benefit whatsoever, anywhere else in the body, suggesting that the etiology may not be inflammatory.          Laboratory Chemistry  Inflammation Markers (CRP: Acute Phase) (ESR: Chronic Phase) Lab Results  Component Value Date   CRP 7.6 (H) 12/11/2017   ESRSEDRATE 39 12/11/2017                         Renal Markers Lab Results  Component  Value Date   BUN 16 12/11/2017   CREATININE 1.04 (H) 12/11/2017   BCR 15 12/11/2017   GFRAA 65 12/11/2017   GFRNONAA 56 (L) 12/11/2017                             Hepatic Markers Lab Results  Component Value Date   AST 22 12/11/2017   ALBUMIN 4.2 12/11/2017                        Neuropathy Markers Lab Results  Component Value Date   VITAMINB12 1,073 12/11/2017                        Note: Lab results reviewed.  Recent Imaging Results   Results for orders placed in visit on 05/16/18  DG C-Arm 1-60 Min-No Report   Narrative Fluoroscopy was utilized by the requesting physician.  No radiographic  interpretation.    Interpretation Report: Fluoroscopy was used during the procedure to assist with needle guidance. The images were interpreted intraoperatively by the requesting physician.  Meds   Current Outpatient Medications:  .  albuterol (PROVENTIL HFA;VENTOLIN HFA) 108 (90 Base) MCG/ACT inhaler, Inhale 2 puffs into the lungs every 6 (six) hours as needed for wheezing or shortness of breath., Disp: , Rfl:  .  amLODipine (NORVASC) 2.5 MG tablet, Take 2.5 mg by mouth daily., Disp: , Rfl:  .  calcium carbonate (OSCAL) 1500 (600 Ca) MG TABS tablet, Take by mouth 2 (two) times daily with a meal., Disp: , Rfl:  .  cetirizine (ZYRTEC) 10 MG tablet, Take 10 mg by mouth daily., Disp: , Rfl:  .  cholecalciferol (VITAMIN D) 400 units TABS tablet, Take 400 Units by mouth., Disp: , Rfl:  .  cyclobenzaprine (FLEXERIL) 10 MG tablet, Take 10 mg by mouth 3 (three) times daily as needed for muscle spasms., Disp: , Rfl:  .  diphenhydrAMINE (BENADRYL) 50 MG tablet, Take 50 mg by mouth at bedtime as needed for itching., Disp: , Rfl:  .  EVENING PRIMROSE OIL PO, Take by mouth daily at 6 (six) AM., Disp: , Rfl:  .  fluconazole (DIFLUCAN) 150 MG tablet, Take 150 mg by mouth daily. Reported on 09/27/2015, Disp: , Rfl:  .  fluticasone (FLONASE) 50 MCG/ACT nasal spray, Place 2 sprays into both nostrils  daily., Disp: , Rfl:  .  gabapentin (NEURONTIN) 300 MG capsule, Take 300 mg by mouth 3 (three) times daily., Disp: , Rfl:  .  Multiple Vitamin (MULTIVITAMIN) tablet, Take 1 tablet by mouth daily., Disp: , Rfl:  .  polyethylene glycol (MIRALAX / GLYCOLAX) packet, Take 1 packet by mouth as needed., Disp: , Rfl:  .  sucralfate (CARAFATE) 1 g tablet, Take 1 g by mouth daily., Disp: , Rfl:  .  tiZANidine (ZANAFLEX) 2 MG tablet, Take 1 tablet (2 mg total) by mouth every 8 (eight) hours as needed for muscle spasms., Disp: 90 tablet, Rfl: 0  ROS  Constitutional: Denies any fever or chills Gastrointestinal: No reported hemesis, hematochezia, vomiting, or acute GI distress Musculoskeletal: Denies any  acute onset joint swelling, redness, loss of ROM, or weakness Neurological: No reported episodes of acute onset apraxia, aphasia, dysarthria, agnosia, amnesia, paralysis, loss of coordination, or loss of consciousness  Allergies  Ms. Pointer is allergic to dicyclomine; minoxidil; triamcinolone acetonide; valacyclovir; amantadines; cymbalta [duloxetine hcl]; erythromycin; hydrochlorothiazide; iodine; meloxicam; nexium [esomeprazole magnesium]; prilosec [omeprazole]; savella [milnacipran hcl]; cleocin [clindamycin hcl]; fluocinolone acetonide; and tramadol.  [place]  Drug: Ms. Starliper  reports that she does not use drugs. Alcohol:  reports that she does not drink alcohol. Tobacco:  reports that she has never smoked. She has never used smokeless tobacco. Medical:  has a past medical history of Chronic constipation, Fatty liver disease, nonalcoholic, Fibromyalgia, GERD (gastroesophageal reflux disease), History of hiatal hernia, and Hypertension. Surgical: Ms. Delao  has a past surgical history that includes Colonoscopy with propofol (N/A, 09/27/2015). Family: family history is not on file.  Constitutional Exam  General appearance: Well nourished, well developed, and well hydrated. In no apparent acute  distress Vitals:   06/03/18 0907  BP: (!) 146/64  Pulse: (!) 53  Resp: 18  Temp: 98.2 F (36.8 C)  TempSrc: Oral  SpO2: 100%  Weight: 170 lb (77.1 kg)  Height: 5' 1"  (1.549 m)   BMI Assessment: Estimated body mass index is 32.12 kg/m as calculated from the following:   Height as of this encounter: 5' 1"  (1.549 m).   Weight as of this encounter: 170 lb (77.1 kg).  BMI interpretation table: BMI level Category Range association with higher incidence of chronic pain  <18 kg/m2 Underweight   18.5-24.9 kg/m2 Ideal body weight   25-29.9 kg/m2 Overweight Increased incidence by 20%  30-34.9 kg/m2 Obese (Class I) Increased incidence by 68%  35-39.9 kg/m2 Severe obesity (Class II) Increased incidence by 136%  >40 kg/m2 Extreme obesity (Class III) Increased incidence by 254%   Patient's current BMI Ideal Body weight  Body mass index is 32.12 kg/m. Ideal body weight: 47.8 kg (105 lb 6.1 oz) Adjusted ideal body weight: 59.5 kg (131 lb 3.6 oz)   BMI Readings from Last 4 Encounters:  06/03/18 32.12 kg/m  05/16/18 32.12 kg/m  05/06/18 32.50 kg/m  04/29/18 32.12 kg/m   Wt Readings from Last 4 Encounters:  06/03/18 170 lb (77.1 kg)  05/16/18 170 lb (77.1 kg)  05/06/18 172 lb (78 kg)  04/29/18 170 lb (77.1 kg)  Psych/Mental status: Alert, oriented x 3 (person, place, & time)       Eyes: PERLA Respiratory: No evidence of acute respiratory distress  Cervical Spine Area Exam  Skin & Axial Inspection: No masses, redness, edema, swelling, or associated skin lesions Alignment: Symmetrical Functional ROM: Unrestricted ROM      Stability: No instability detected Muscle Tone/Strength: Functionally intact. No obvious neuro-muscular anomalies detected. Sensory (Neurological): Unimpaired Palpation: No palpable anomalies              Upper Extremity (UE) Exam    Side: Right upper extremity  Side: Left upper extremity  Skin & Extremity Inspection: Skin color, temperature, and hair growth  are WNL. No peripheral edema or cyanosis. No masses, redness, swelling, asymmetry, or associated skin lesions. No contractures.  Skin & Extremity Inspection: Skin color, temperature, and hair growth are WNL. No peripheral edema or cyanosis. No masses, redness, swelling, asymmetry, or associated skin lesions. No contractures.  Functional ROM: Unrestricted ROM          Functional ROM: Unrestricted ROM          Muscle Tone/Strength: Functionally intact.  No obvious neuro-muscular anomalies detected.  Muscle Tone/Strength: Functionally intact. No obvious neuro-muscular anomalies detected.  Sensory (Neurological): Unimpaired          Sensory (Neurological): Unimpaired          Palpation: No palpable anomalies              Palpation: No palpable anomalies              Provocative Test(s):  Phalen's test: deferred Tinel's test: deferred Apley's scratch test (touch opposite shoulder):  Action 1 (Across chest): deferred Action 2 (Overhead): deferred Action 3 (LB reach): deferred   Provocative Test(s):  Phalen's test: deferred Tinel's test: deferred Apley's scratch test (touch opposite shoulder):  Action 1 (Across chest): deferred Action 2 (Overhead): deferred Action 3 (LB reach): deferred    Thoracic Spine Area Exam  Skin & Axial Inspection: No masses, redness, or swelling Alignment: Symmetrical Functional ROM: Unrestricted ROM Stability: No instability detected Muscle Tone/Strength: Functionally intact. No obvious neuro-muscular anomalies detected. Sensory (Neurological): Unimpaired Muscle strength & Tone: No palpable anomalies  Lumbar Spine Area Exam  Skin & Axial Inspection: No masses, redness, or swelling Alignment: Symmetrical Functional ROM: Decreased ROM       Stability: No instability detected Muscle Tone/Strength: Increased muscle tone over affected area Sensory (Neurological): Unimproved. Palpation: Complains of area being tender to palpation       Provocative  Tests: Hyperextension/rotation test: (+) bilaterally for facet joint pain. Lumbar quadrant test (Kemp's test): (+) bilaterally for facet joint pain. Lateral bending test: deferred today       Patrick's Maneuver: deferred today                   FABER test: deferred today                   S-I anterior distraction/compression test: deferred today         S-I lateral compression test: deferred today         S-I Thigh-thrust test: deferred today         S-I Gaenslen's test: deferred today          Gait & Posture Assessment  Ambulation: Unassisted Gait: Relatively normal for age and body habitus Posture: Antalgic   Lower Extremity Exam    Side: Right lower extremity  Side: Left lower extremity  Stability: No instability observed          Stability: No instability observed          Skin & Extremity Inspection: Skin color, temperature, and hair growth are WNL. No peripheral edema or cyanosis. No masses, redness, swelling, asymmetry, or associated skin lesions. No contractures.  Skin & Extremity Inspection: Skin color, temperature, and hair growth are WNL. No peripheral edema or cyanosis. No masses, redness, swelling, asymmetry, or associated skin lesions. No contractures.  Functional ROM: Unrestricted ROM                  Functional ROM: Unrestricted ROM                  Muscle Tone/Strength: Functionally intact. No obvious neuro-muscular anomalies detected.  Muscle Tone/Strength: Functionally intact. No obvious neuro-muscular anomalies detected.  Sensory (Neurological): Unimpaired  Sensory (Neurological): Unimpaired  Palpation: No palpable anomalies  Palpation: No palpable anomalies   Assessment  Primary Diagnosis & Pertinent Problem List: The primary encounter diagnosis was Chronic low back pain (Primary Area of Pain) (Bilateral) (R>L). Diagnoses of Chronic lower extremity pain (Secondary  Area of Pain) (Bilateral) (R>L), Chronic neck pain (Tertiary Area of Pain) (Bilateral) (R>L), and  Chronic upper extremity pain (Fourth Area of Pain) (Bilateral) (R>L) were also pertinent to this visit.  Status Diagnosis  Unimproved Unimproved Unimproved 1. Chronic low back pain (Primary Area of Pain) (Bilateral) (R>L)   2. Chronic lower extremity pain (Secondary Area of Pain) (Bilateral) (R>L)   3. Chronic neck pain (Tertiary Area of Pain) (Bilateral) (R>L)   4. Chronic upper extremity pain (Fourth Area of Pain) (Bilateral) (R>L)     Problems updated and reviewed during this visit: No problems updated. Plan of Care  Pharmacotherapy (Medications Ordered): No orders of the defined types were placed in this encounter.  Medications administered today: Evadne Ose had no medications administered during this visit.   Procedure Orders     Lumbar Epidural Injection Lab Orders  No laboratory test(s) ordered today   Imaging Orders  No imaging studies ordered today   Referral Orders  No referral(s) requested today   Interventional management options: Planned, scheduled, and/or pending:   Diagnostic left L4-5 LESI #1 under fluoro and IV sedation. (Avoid Kenalog)   Considering:   Diagnostic bilateral lumbar facet nerve block  Possible bilateral lumbar facet RFA  Diagnostic right-sided sacroiliac joint block  Possible right-sided sacroiliac joint RFA  Diagnostic right CESI  Diagnostic bilateral cervical facet nerve block  Possible bilateral cervical facet RFA  Diagnostic bilateral L4-5 transforaminal LESI  Diagnostic right-sided L3-4 LESI  Diagnostic trigger point injections Diagnosticlidocaine infusion   Palliative PRN treatment(s):   None at this time   Provider-requested follow-up: Return for Procedure (w/ sedation): (L) L4-5 LESI #1.  Future Appointments  Date Time Provider [place]  06/20/2018  9:30 AM Milinda Pointer, MD [facility] None   Primary Care Physician: Renee Rival, NP Location: [facility] Out[facility] Facility Note by:  Gaspar Cola, MD Date: 06/03/2018; Time: 1:10 PM

## 2018-06-03 ENCOUNTER — Encounter: Payer: Self-pay | Admitting: Pain Medicine

## 2018-06-03 ENCOUNTER — Other Ambulatory Visit: Payer: Self-pay

## 2018-06-03 ENCOUNTER — Ambulatory Visit: Payer: Medicare Other | Attending: Pain Medicine | Admitting: Pain Medicine

## 2018-06-03 VITALS — BP 146/64 | HR 53 | Temp 98.2°F | Resp 18 | Ht 61.0 in | Wt 170.0 lb

## 2018-06-03 DIAGNOSIS — M5116 Intervertebral disc disorders with radiculopathy, lumbar region: Secondary | ICD-10-CM | POA: Diagnosis not present

## 2018-06-03 DIAGNOSIS — Z881 Allergy status to other antibiotic agents status: Secondary | ICD-10-CM | POA: Diagnosis not present

## 2018-06-03 DIAGNOSIS — I10 Essential (primary) hypertension: Secondary | ICD-10-CM | POA: Diagnosis not present

## 2018-06-03 DIAGNOSIS — M4802 Spinal stenosis, cervical region: Secondary | ICD-10-CM | POA: Insufficient documentation

## 2018-06-03 DIAGNOSIS — M545 Low back pain, unspecified: Secondary | ICD-10-CM

## 2018-06-03 DIAGNOSIS — M79602 Pain in left arm: Secondary | ICD-10-CM

## 2018-06-03 DIAGNOSIS — G894 Chronic pain syndrome: Secondary | ICD-10-CM | POA: Diagnosis not present

## 2018-06-03 DIAGNOSIS — Z888 Allergy status to other drugs, medicaments and biological substances status: Secondary | ICD-10-CM | POA: Diagnosis not present

## 2018-06-03 DIAGNOSIS — K219 Gastro-esophageal reflux disease without esophagitis: Secondary | ICD-10-CM | POA: Insufficient documentation

## 2018-06-03 DIAGNOSIS — M542 Cervicalgia: Secondary | ICD-10-CM

## 2018-06-03 DIAGNOSIS — M79601 Pain in right arm: Secondary | ICD-10-CM | POA: Diagnosis not present

## 2018-06-03 DIAGNOSIS — M48061 Spinal stenosis, lumbar region without neurogenic claudication: Secondary | ICD-10-CM | POA: Diagnosis not present

## 2018-06-03 DIAGNOSIS — M533 Sacrococcygeal disorders, not elsewhere classified: Secondary | ICD-10-CM | POA: Diagnosis not present

## 2018-06-03 DIAGNOSIS — M79604 Pain in right leg: Secondary | ICD-10-CM

## 2018-06-03 DIAGNOSIS — K449 Diaphragmatic hernia without obstruction or gangrene: Secondary | ICD-10-CM | POA: Insufficient documentation

## 2018-06-03 DIAGNOSIS — G8929 Other chronic pain: Secondary | ICD-10-CM

## 2018-06-03 DIAGNOSIS — Z79899 Other long term (current) drug therapy: Secondary | ICD-10-CM | POA: Diagnosis not present

## 2018-06-03 DIAGNOSIS — M79605 Pain in left leg: Secondary | ICD-10-CM

## 2018-06-03 NOTE — Patient Instructions (Addendum)
____________________________________________________________________________________________  Pain Scale  Introduction: The pain score used by this practice is the Verbal Numerical Rating Scale (VNRS-11). This is an 11-point scale. It is for adults and children 10 years or older. There are significant differences in how the pain score is reported, used, and applied. Forget everything you learned in the past and learn this scoring system.  General Information: The scale should reflect your current level of pain. Unless you are specifically asked for the level of your worst pain, or your average pain. If you are asked for one of these two, then it should be understood that it is over the past 24 hours.  Basic Activities of Daily Living (ADL): Personal hygiene, dressing, eating, transferring, and using restroom.  Instructions: Most patients tend to report their level of pain as a combination of two factors, their physical pain and their psychosocial pain. This last one is also known as "suffering" and it is reflection of how physical pain affects you socially and psychologically. From now on, report them separately. From this point on, when asked to report your pain level, report only your physical pain. Use the following table for reference.  Pain Clinic Pain Levels (0-5/10)  Pain Level Score  Description  No Pain 0   Mild pain 1 Nagging, annoying, but does not interfere with basic activities of daily living (ADL). Patients are able to eat, bathe, get dressed, toileting (being able to get on and off the toilet and perform personal hygiene functions), transfer (move in and out of bed or a chair without assistance), and maintain continence (able to control bladder and bowel functions). Blood pressure and heart rate are unaffected. A normal heart rate for a healthy adult ranges from 60 to 100 bpm (beats per minute).   Mild to moderate pain 2 Noticeable and distracting. Impossible to hide from other  people. More frequent flare-ups. Still possible to adapt and function close to normal. It can be very annoying and may have occasional stronger flare-ups. With discipline, patients may get used to it and adapt.   Moderate pain 3 Interferes significantly with activities of daily living (ADL). It becomes difficult to feed, bathe, get dressed, get on and off the toilet or to perform personal hygiene functions. Difficult to get in and out of bed or a chair without assistance. Very distracting. With effort, it can be ignored when deeply involved in activities.   Moderately severe pain 4 Impossible to ignore for more than a few minutes. With effort, patients may still be able to manage work or participate in some social activities. Very difficult to concentrate. Signs of autonomic nervous system discharge are evident: dilated pupils (mydriasis); mild sweating (diaphoresis); sleep interference. Heart rate becomes elevated (>115 bpm). Diastolic blood pressure (lower number) rises above 100 mmHg. Patients find relief in laying down and not moving.   Severe pain 5 Intense and extremely unpleasant. Associated with frowning face and frequent crying. Pain overwhelms the senses.  Ability to do any activity or maintain social relationships becomes significantly limited. Conversation becomes difficult. Pacing back and forth is common, as getting into a comfortable position is nearly impossible. Pain wakes you up from deep sleep. Physical signs will be obvious: pupillary dilation; increased sweating; goosebumps; brisk reflexes; cold, clammy hands and feet; nausea, vomiting or dry heaves; loss of appetite; significant sleep disturbance with inability to fall asleep or to remain asleep. When persistent, significant weight loss is observed due to the complete loss of appetite and sleep deprivation.  Blood   pressure and heart rate becomes significantly elevated. Caution: If elevated blood pressure triggers a pounding headache  associated with blurred vision, then the patient should immediately seek attention at an urgent or emergency care unit, as these may be signs of an impending stroke.    Emergency Department Pain Levels (6-10/10)  Emergency Room Pain 6 Severely limiting. Requires emergency care and should not be seen or managed at an outpatient pain management facility. Communication becomes difficult and requires great effort. Assistance to reach the emergency department may be required. Facial flushing and profuse sweating along with potentially dangerous increases in heart rate and blood pressure will be evident.   Distressing pain 7 Self-care is very difficult. Assistance is required to transport, or use restroom. Assistance to reach the emergency department will be required. Tasks requiring coordination, such as bathing and getting dressed become very difficult.   Disabling pain 8 Self-care is no longer possible. At this level, pain is disabling. The individual is unable to do even the most "basic" activities such as walking, eating, bathing, dressing, transferring to a bed, or toileting. Fine motor skills are lost. It is difficult to think clearly.   Incapacitating pain 9 Pain becomes incapacitating. Thought processing is no longer possible. Difficult to remember your own name. Control of movement and coordination are lost.   The worst pain imaginable 10 At this level, most patients pass out from pain. When this level is reached, collapse of the autonomic nervous system occurs, leading to a sudden drop in blood pressure and heart rate. This in turn results in a temporary and dramatic drop in blood flow to the brain, leading to a loss of consciousness. Fainting is one of the body's self defense mechanisms. Passing out puts the brain in a calmed state and causes it to shut down for a while, in order to begin the healing process.    Summary: 1. Refer to this scale when providing us with your pain level. 2. Be  accurate and careful when reporting your pain level. This will help with your care. 3. Over-reporting your pain level will lead to loss of credibility. 4. Even a level of 1/10 means that there is pain and will be treated at our facility. 5. High, inaccurate reporting will be documented as "Symptom Exaggeration", leading to loss of credibility and suspicions of possible secondary gains such as obtaining more narcotics, or wanting to appear disabled, for fraudulent reasons. 6. Only pain levels of 5 or below will be seen at our facility. 7. Pain levels of 6 and above will be sent to the Emergency Department and the appointment cancelled. ____________________________________________________________________________________________   ____________________________________________________________________________________________  Preparing for Procedure with Sedation  Instructions: . Oral Intake: Do not eat or drink anything for at least 8 hours prior to your procedure. . Transportation: Public transportation is not allowed. Bring an adult driver. The driver must be physically present in our waiting room before any procedure can be started. . Physical Assistance: Bring an adult physically capable of assisting you, in the event you need help. This adult should keep you company at home for at least 6 hours after the procedure. . Blood Pressure Medicine: Take your blood pressure medicine with a sip of water the morning of the procedure. . Blood thinners: Notify our staff if you are taking any blood thinners. Depending on which one you take, there will be specific instructions on how and when to stop it. . Diabetics on insulin: Notify the staff so that you can be   scheduled 1st case in the morning. If your diabetes requires high dose insulin, take only  of your normal insulin dose the morning of the procedure and notify the staff that you have done so. . Preventing infections: Shower with an antibacterial soap  the morning of your procedure. . Build-up your immune system: Take 1000 mg of Vitamin C with every meal (3 times a day) the day prior to your procedure. Marland Kitchen Antibiotics: Inform the staff if you have a condition or reason that requires you to take antibiotics before dental procedures. . Pregnancy: If you are pregnant, call and cancel the procedure. . Sickness: If you have a cold, fever, or any active infections, call and cancel the procedure. . Arrival: You must be in the facility at least 30 minutes prior to your scheduled procedure. . Children: Do not bring children with you. . Dress appropriately: Bring dark clothing that you would not mind if they get stained. . Valuables: Do not bring any jewelry or valuables.  Procedure appointments are reserved for interventional treatments only. Marland Kitchen No Prescription Refills. . No medication changes will be discussed during procedure appointments. . No disability issues will be discussed.  Reasons to call and reschedule or cancel your procedure: (Following these recommendations will minimize the risk of a serious complication.) . Surgeries: Avoid having procedures within 2 weeks of any surgery. (Avoid for 2 weeks before or after any surgery). . Flu Shots: Avoid having procedures within 2 weeks of a flu shots or . (Avoid for 2 weeks before or after immunizations). . Barium: Avoid having a procedure within 7-10 days after having had a radiological study involving the use of radiological contrast. (Myelograms, Barium swallow or enema study). . Heart attacks: Avoid any elective procedures or surgeries for the initial 6 months after a "Myocardial Infarction" (Heart Attack). . Blood thinners: It is imperative that you stop these medications before procedures. Let us know if you if you take any blood thinner.  . Infection: Avoid procedures during or within two weeks of an infection (including chest colds or gastrointestinal problems). Symptoms associated with  infections include: Localized redness, fever, chills, night sweats or profuse sweating, burning sensation when voiding, cough, congestion, stuffiness, runny nose, sore throat, diarrhea, nausea, vomiting, cold or Flu symptoms, recent or current infections. It is specially important if the infection is over the area that we intend to treat. Marland Kitchen Heart and lung problems: Symptoms that may suggest an active cardiopulmonary problem include: cough, chest pain, breathing difficulties or shortness of breath, dizziness, ankle swelling, uncontrolled high or unusually low blood pressure, and/or palpitations. If you are experiencing any of these symptoms, cancel your procedure and contact your primary care physician for an evaluation.  Remember:  Regular Business hours are:  Monday to Thursday 8:00 AM to 4:00 PM  Provider's Schedule: Milinda Pointer, MD:  Procedure days: Tuesday and Thursday 7:30 AM to 4:00 PM  Gillis Santa, MD:  Procedure days: Monday and Wednesday 7:30 AM to 4:00 PM ____________________________________________________________________________________________   Pain Management Discharge Instructions  General Discharge Instructions :  If you need to reach your doctor call: Monday-Friday 8:00 am - 4:00 pm at [phone] or toll free [phone].  After clinic hours [phone] to have operator reach doctor.  Bring all of your medication bottles to all your appointments in the pain clinic.  To cancel or reschedule your appointment with Pain Management please remember to call 24 hours in advance to avoid a fee.  Refer to the educational materials which you have  been given on: General Risks, I had my Procedure. Discharge Instructions, Post Sedation.  Post Procedure Instructions:  The drugs you were given will stay in your system until tomorrow, so for the next 24 hours you should not drive, make any legal decisions or drink any alcoholic beverages.  You may eat anything you  prefer, but it is better to start with liquids then soups and crackers, and gradually work up to solid foods.  Please notify your doctor immediately if you have any unusual bleeding, trouble breathing or pain that is not related to your normal pain.  Depending on the type of procedure that was done, some parts of your body may feel week and/or numb.  This usually clears up by tonight or the next day.  Walk with the use of an assistive device or accompanied by an adult for the 24 hours.  You may use ice on the affected area for the first 24 hours.  Put ice in a Ziploc bag and cover with a towel and place against area 15 minutes on 15 minutes off.  You may switch to heat after 24 hours.GENERAL RISKS AND COMPLICATIONS  What are the risk, side effects and possible complications? Generally speaking, most procedures are safe.  However, with any procedure there are risks, side effects, and the possibility of complications.  The risks and complications are dependent upon the sites that are lesioned, or the type of nerve block to be performed.  The closer the procedure is to the spine, the more serious the risks are.  Great care is taken when placing the radio frequency needles, block needles or lesioning probes, but sometimes complications can occur. 1. Infection: Any time there is an injection through the skin, there is a risk of infection.  This is why sterile conditions are used for these blocks.  There are four possible types of infection. 1. Localized skin infection. 2. Central Nervous System Infection-This can be in the form of Meningitis, which can be deadly. 3. Epidural Infections-This can be in the form of an epidural abscess, which can cause pressure inside of the spine, causing compression of the spinal cord with subsequent paralysis. This would require an emergency surgery to decompress, and there are no guarantees that the patient would recover from the paralysis. 4. Discitis-This is an  infection of the intervertebral discs.  It occurs in about 1% of discography procedures.  It is difficult to treat and it may lead to surgery.        2. Pain: the needles have to go through skin and soft tissues, will cause soreness.       3. Damage to internal structures:  The nerves to be lesioned may be near blood vessels or    other nerves which can be potentially damaged.       4. Bleeding: Bleeding is more common if the patient is taking blood thinners such as  aspirin, Coumadin, Ticiid, Plavix, etc., or if he/she have some genetic predisposition  such as hemophilia. Bleeding into the spinal canal can cause compression of the spinal  cord with subsequent paralysis.  This would require an emergency surgery to  decompress and there are no guarantees that the patient would recover from the  paralysis.       5. Pneumothorax:  Puncturing of a lung is a possibility, every time a needle is introduced in  the area of the chest or upper back.  Pneumothorax refers to free air around the  collapsed lung(s), inside  of the thoracic cavity (chest cavity).  Another two possible  complications related to a similar event would include: Hemothorax and Chylothorax.   These are variations of the Pneumothorax, where instead of air around the collapsed  lung(s), you may have blood or chyle, respectively.       6. Spinal headaches: They may occur with any procedures in the area of the spine.       7. Persistent CSF (Cerebro-Spinal Fluid) leakage: This is a rare problem, but may occur  with prolonged intrathecal or epidural catheters either due to the formation of a fistulous  track or a dural tear.       8. Nerve damage: By working so close to the spinal cord, there is always a possibility of  nerve damage, which could be as serious as a permanent spinal cord injury with  paralysis.       9. Death:  Although rare, severe deadly allergic reactions known as "Anaphylactic  reaction" can occur to any of the medications used.       10. Worsening of the symptoms:  We can always make thing worse.  What are the chances of something like this happening? Chances of any of this occuring are extremely low.  By statistics, you have more of a chance of getting killed in a motor vehicle accident: while driving to the hospital than any of the above occurring .  Nevertheless, you should be aware that they are possibilities.  In general, it is similar to taking a shower.  Everybody knows that you can slip, hit your head and get killed.  Does that mean that you should not shower again?  Nevertheless always keep in mind that statistics do not mean anything if you happen to be on the wrong side of them.  Even if a procedure has a 1 (one) in a 1,000,000 (million) chance of going wrong, it you happen to be that one..Also, keep in mind that by statistics, you have more of a chance of having something go wrong when taking medications.  Who should not have this procedure? If you are on a blood thinning medication (e.g. Coumadin, Plavix, see list of "Blood Thinners"), or if you have an active infection going on, you should not have the procedure.  If you are taking any blood thinners, please inform your physician.  How should I prepare for this procedure?  Do not eat or drink anything at least six hours prior to the procedure.  Bring a driver with you .  It cannot be a taxi.  Come accompanied by an adult that can drive you back, and that is strong enough to help you if your legs get weak or numb from the local anesthetic.  Take all of your medicines the morning of the procedure with just enough water to swallow them.  If you have diabetes, make sure that you are scheduled to have your procedure done first thing in the morning, whenever possible.  If you have diabetes, take only half of your insulin dose and notify our nurse that you have done so as soon as you arrive at the clinic.  If you are diabetic, but only take blood sugar pills  (oral hypoglycemic), then do not take them on the morning of your procedure.  You may take them after you have had the procedure.  Do not take aspirin or any aspirin-containing medications, at least eleven (11) days prior to the procedure.  They may prolong bleeding.  Wear loose  fitting clothing that may be easy to take off and that you would not mind if it got stained with Betadine or blood.  Do not wear any jewelry or perfume  Remove any nail coloring.  It will interfere with some of our monitoring equipment.  NOTE: Remember that this is not meant to be interpreted as a complete list of all possible complications.  Unforeseen problems may occur.  BLOOD THINNERS The following drugs contain aspirin or other products, which can cause increased bleeding during surgery and should not be taken for 2 weeks prior to and 1 week after surgery.  If you should need take something for relief of minor pain, you may take acetaminophen which is found in Tylenol,m Datril, Anacin-3 and Panadol. It is not blood thinner. The products listed below are.  Do not take any of the products listed below in addition to any listed on your instruction sheet.  A.P.C or A.P.C with Codeine Codeine Phosphate Capsules #3 Ibuprofen Ridaura  ABC compound Congesprin Imuran rimadil  Advil Cope Indocin Robaxisal  Alka-Seltzer Effervescent Pain Reliever and Antacid Coricidin or Coricidin-D  Indomethacin Rufen  Alka-Seltzer plus Cold Medicine Cosprin Ketoprofen S-A-C Tablets  Anacin Analgesic Tablets or Capsules Coumadin Korlgesic Salflex  Anacin Extra Strength Analgesic tablets or capsules CP-2 Tablets Lanoril Salicylate  Anaprox Cuprimine Capsules Levenox Salocol  Anexsia-D Dalteparin Magan Salsalate  Anodynos Darvon compound Magnesium Salicylate Sine-off  Ansaid Dasin Capsules Magsal Sodium Salicylate  Anturane Depen Capsules Marnal Soma  APF Arthritis pain formula Dewitt's Pills Measurin Stanback  Argesic Dia-Gesic  Meclofenamic Sulfinpyrazone  Arthritis Bayer Timed Release Aspirin Diclofenac Meclomen Sulindac  Arthritis pain formula Anacin Dicumarol Medipren Supac  Analgesic (Safety coated) Arthralgen Diffunasal Mefanamic Suprofen  Arthritis Strength Bufferin Dihydrocodeine Mepro Compound Suprol  Arthropan liquid Dopirydamole Methcarbomol with Aspirin Synalgos  ASA tablets/Enseals Disalcid Micrainin Tagament  Ascriptin Doan's Midol Talwin  Ascriptin A/D Dolene Mobidin Tanderil  Ascriptin Extra Strength Dolobid Moblgesic Ticlid  Ascriptin with Codeine Doloprin or Doloprin with Codeine Momentum Tolectin  Asperbuf Duoprin Mono-gesic Trendar  Aspergum Duradyne Motrin or Motrin IB Triminicin  Aspirin plain, buffered or enteric coated Durasal Myochrisine Trigesic  Aspirin Suppositories Easprin Nalfon Trillsate  Aspirin with Codeine Ecotrin Regular or Extra Strength Naprosyn Uracel  Atromid-S Efficin Naproxen Ursinus  Auranofin Capsules Elmiron Neocylate Vanquish  Axotal Emagrin Norgesic Verin  Azathioprine Empirin or Empirin with Codeine Normiflo Vitamin E  Azolid Emprazil Nuprin Voltaren  Bayer Aspirin plain, buffered or children's or timed BC Tablets or powders Encaprin Orgaran Warfarin Sodium  Buff-a-Comp Enoxaparin Orudis Zorpin  Buff-a-Comp with Codeine Equegesic Os-Cal-Gesic   Buffaprin Excedrin plain, buffered or Extra Strength Oxalid   Bufferin Arthritis Strength Feldene Oxphenbutazone   Bufferin plain or Extra Strength Feldene Capsules Oxycodone with Aspirin   Bufferin with Codeine Fenoprofen Fenoprofen Pabalate or Pabalate-SF   Buffets II Flogesic Panagesic   Buffinol plain or Extra Strength Florinal or Florinal with Codeine Panwarfarin   Buf-Tabs Flurbiprofen Penicillamine   Butalbital Compound Four-way cold tablets Penicillin   Butazolidin Fragmin Pepto-Bismol   Carbenicillin Geminisyn Percodan   Carna Arthritis Reliever Geopen Persantine   Carprofen Gold's salt Persistin    Chloramphenicol Goody's Phenylbutazone   Chloromycetin Haltrain Piroxlcam   Clmetidine heparin Plaquenil   Cllnoril Hyco-pap Ponstel   Clofibrate Hydroxy chloroquine Propoxyphen         Before stopping any of these medications, be sure to consult the physician who ordered them.  Some, such as Coumadin (Warfarin) are ordered to prevent or treat  serious conditions such as "deep thrombosis", "pumonary embolisms", and other heart problems.  The amount of time that you may need off of the medication may also vary with the medication and the reason for which you were taking it.  If you are taking any of these medications, please make sure you notify your pain physician before you undergo any procedures.         Epidural Steroid Injection Patient Information  Description: The epidural space surrounds the nerves as they exit the spinal cord.  In some patients, the nerves can be compressed and inflamed by a bulging disc or a tight spinal canal (spinal stenosis).  By injecting steroids into the epidural space, we can bring irritated nerves into direct contact with a potentially helpful medication.  These steroids act directly on the irritated nerves and can reduce swelling and inflammation which often leads to decreased pain.  Epidural steroids may be injected anywhere along the spine and from the neck to the low back depending upon the location of your pain.   After numbing the skin with local anesthetic (like Novocaine), a small needle is passed into the epidural space slowly.  You may experience a sensation of pressure while this is being done.  The entire block usually last less than 10 minutes.  Conditions which may be treated by epidural steroids:   Low back and leg pain  Neck and arm pain  Spinal stenosis  Post-laminectomy syndrome  Herpes zoster (shingles) pain  Pain from compression fractures  Preparation for the injection:  1. Do not eat any solid food or dairy products  within 8 hours of your appointment.  2. You may drink clear liquids up to 3 hours before appointment.  Clear liquids include water, black coffee, juice or soda.  No milk or cream please. 3. You may take your regular medication, including pain medications, with a sip of water before your appointment  Diabetics should hold regular insulin (if taken separately) and take 1/2 normal NPH dos the morning of the procedure.  Carry some sugar containing items with you to your appointment. 4. A driver must accompany you and be prepared to drive you home after your procedure.  5. Bring all your current medications with your. 6. An IV may be inserted and sedation may be given at the discretion of the physician.   7. A blood pressure cuff, EKG and other monitors will often be applied during the procedure.  Some patients may need to have extra oxygen administered for a short period. 8. You will be asked to provide medical information, including your allergies, prior to the procedure.  We must know immediately if you are taking blood thinners (like Coumadin/Warfarin)  Or if you are allergic to IV iodine contrast (dye). We must know if you could possible be pregnant.  Possible side-effects:  Bleeding from needle site  Infection (rare, may require surgery)  Nerve injury (rare)  Numbness & tingling (temporary)  Difficulty urinating (rare, temporary)  Spinal headache ( a headache worse with upright posture)  Light -headedness (temporary)  Pain at injection site (several days)  Decreased blood pressure (temporary)  Weakness in arm/leg (temporary)  Pressure sensation in back/neck (temporary)  Call if you experience:  Fever/chills associated with headache or increased back/neck pain.  Headache worsened by an upright position.  New onset weakness or numbness of an extremity below the injection site  Hives or difficulty breathing (go to the emergency room)  Inflammation or drainage at the infection  site  Severe back/neck pain  Any new symptoms which are concerning to you  Please note:  Although the local anesthetic injected can often make your back or neck feel good for several hours after the injection, the pain will likely return.  It takes 3-7 days for steroids to work in the epidural space.  You may not notice any pain relief for at least that one week.  If effective, we will often do a series of three injections spaced 3-6 weeks apart to maximally decrease your pain.  After the initial series, we generally will wait several months before considering a repeat injection of the same type.  If you have any questions, please call [phone] [facility]

## 2018-06-03 NOTE — Progress Notes (Signed)
Safety precautions to be maintained throughout the outpatient stay will include: orient to surroundings, keep bed in low position, maintain call bell within reach at all times, provide assistance with transfer out of bed and ambulation.  

## 2018-06-20 ENCOUNTER — Other Ambulatory Visit: Payer: Self-pay

## 2018-06-20 ENCOUNTER — Encounter: Payer: Self-pay | Admitting: Pain Medicine

## 2018-06-20 ENCOUNTER — Ambulatory Visit (HOSPITAL_BASED_OUTPATIENT_CLINIC_OR_DEPARTMENT_OTHER): Payer: Medicare Other | Admitting: Pain Medicine

## 2018-06-20 ENCOUNTER — Ambulatory Visit
Admission: RE | Admit: 2018-06-20 | Discharge: 2018-06-20 | Disposition: A | Discharge status: Home / Self Care | Payer: Medicare Other | Source: Ambulatory Visit | Attending: Pain Medicine | Admitting: Pain Medicine

## 2018-06-20 VITALS — BP 153/74 | HR 51 | Temp 97.0°F | Resp 11 | Ht 61.0 in | Wt 167.0 lb

## 2018-06-20 DIAGNOSIS — M48061 Spinal stenosis, lumbar region without neurogenic claudication: Secondary | ICD-10-CM | POA: Diagnosis not present

## 2018-06-20 DIAGNOSIS — Z881 Allergy status to other antibiotic agents status: Secondary | ICD-10-CM | POA: Insufficient documentation

## 2018-06-20 DIAGNOSIS — M5116 Intervertebral disc disorders with radiculopathy, lumbar region: Secondary | ICD-10-CM | POA: Diagnosis not present

## 2018-06-20 DIAGNOSIS — G8929 Other chronic pain: Secondary | ICD-10-CM | POA: Diagnosis not present

## 2018-06-20 DIAGNOSIS — M79604 Pain in right leg: Secondary | ICD-10-CM

## 2018-06-20 DIAGNOSIS — Z79899 Other long term (current) drug therapy: Secondary | ICD-10-CM | POA: Diagnosis not present

## 2018-06-20 DIAGNOSIS — M5136 Other intervertebral disc degeneration, lumbar region: Secondary | ICD-10-CM

## 2018-06-20 DIAGNOSIS — M5441 Lumbago with sciatica, right side: Secondary | ICD-10-CM

## 2018-06-20 DIAGNOSIS — M532X6 Spinal instabilities, lumbar region: Secondary | ICD-10-CM | POA: Insufficient documentation

## 2018-06-20 DIAGNOSIS — M5416 Radiculopathy, lumbar region: Secondary | ICD-10-CM

## 2018-06-20 DIAGNOSIS — M79605 Pain in left leg: Secondary | ICD-10-CM

## 2018-06-20 DIAGNOSIS — M5442 Lumbago with sciatica, left side: Secondary | ICD-10-CM

## 2018-06-20 MED ORDER — MIDAZOLAM HCL 5 MG/5ML IJ SOLN
1.0000 mg | INTRAMUSCULAR | Status: DC | PRN
  Administered 2018-06-20: 2 mg via INTRAVENOUS
  Filled 2018-06-20: qty 5

## 2018-06-20 MED ORDER — SODIUM CHLORIDE 0.9% FLUSH
2.0000 mL | Freq: Once | INTRAVENOUS | Status: AC
  Administered 2018-06-20: 2 mL

## 2018-06-20 MED ORDER — IOPAMIDOL (ISOVUE-M 200) INJECTION 41%
10.0000 mL | Freq: Once | INTRAMUSCULAR | Status: AC
  Administered 2018-06-20: 10 mL via EPIDURAL
  Filled 2018-06-20: qty 10

## 2018-06-20 MED ORDER — ROPIVACAINE HCL 2 MG/ML IJ SOLN
2.0000 mL | Freq: Once | INTRAMUSCULAR | Status: AC
  Administered 2018-06-20: 2 mL via EPIDURAL
  Filled 2018-06-20: qty 10

## 2018-06-20 MED ORDER — LIDOCAINE HCL 2 % IJ SOLN
20.0000 mL | Freq: Once | INTRAMUSCULAR | Status: AC
  Administered 2018-06-20: 400 mg
  Filled 2018-06-20: qty 40

## 2018-06-20 MED ORDER — DEXAMETHASONE SODIUM PHOSPHATE 10 MG/ML IJ SOLN
10.0000 mg | Freq: Once | INTRAMUSCULAR | Status: AC
  Administered 2018-06-20: 10 mg
  Filled 2018-06-20: qty 1

## 2018-06-20 MED ORDER — FENTANYL CITRATE (PF) 100 MCG/2ML IJ SOLN
25.0000 ug | INTRAMUSCULAR | Status: DC | PRN
  Administered 2018-06-20: 50 ug via INTRAVENOUS
  Filled 2018-06-20: qty 2

## 2018-06-20 MED ORDER — SODIUM CHLORIDE (PF) 0.9 % IJ SOLN
INTRAMUSCULAR | Status: AC
  Filled 2018-06-20: qty 10

## 2018-06-20 MED ORDER — LACTATED RINGERS IV SOLN
1000.0000 mL | Freq: Once | INTRAVENOUS | Status: AC
  Administered 2018-06-20: 1000 mL via INTRAVENOUS

## 2018-06-20 NOTE — Progress Notes (Signed)
Safety precautions to be maintained throughout the outpatient stay will include: orient to surroundings, keep bed in low position, maintain call bell within reach at all times, provide assistance with transfer out of bed and ambulation.  

## 2018-06-20 NOTE — Patient Instructions (Signed)

## 2018-06-20 NOTE — Progress Notes (Signed)
Patient's Name: Andrea Yang  MRN: [id number]  Referring Provider: Renee Rival, NP  DOB: 1950/10/17  PCP: Renee Rival, NP  DOS: 06/20/2018  Note by: Gaspar Cola, MD  Service setting: Ambulatory outpatient  Specialty: Interventional Pain Management  Patient type: Established  Location: [facility] (AMB) Pain Management Facility  Visit type: Interventional Procedure   Primary Reason for Visit: Interventional Pain Management Treatment. CC: Procedure  Procedure:          Anesthesia, Analgesia, Anxiolysis:  Type: Diagnostic Inter-Laminar Epidural Steroid Injection  #1  Region: Lumbar Level: L4-5 Level. Laterality: Left-Sided Paramedial  Type: Moderate (Conscious) Sedation combined with Local Anesthesia Indication(s): Analgesia and Anxiety Route: Intravenous (IV) IV Access: Secured Sedation: Meaningful verbal contact was maintained at all times during the procedure  Local Anesthetic: Lidocaine 1-2%  Position: Prone with head of the table was raised to facilitate breathing.   Indications: 1. DDD (degenerative disc disease), lumbar   2. Lumbar radiculitis (Bilateral) (R>L)   3. 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 Chronic lower extremity pain (Secondary Area of Pain) (Bilateral) (R>L)   6. 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 (year old), female patient, seen today for interventional treatment. She  has a past surgical history that includes Colonoscopy with propofol (N/A, 09/27/2015). Ms. Min has a current medication list which includes the following prescription(s): albuterol, amlodipine, calcium carbonate, cetirizine, cholecalciferol, cyclobenzaprine, diphenhydramine, evening primrose  oil, fluconazole, fluticasone, gabapentin, multivitamin, polyethylene glycol, sucralfate, and tizanidine, and the following Facility-Administered Medications: fentanyl and midazolam. Her primarily concern today is the Procedure  Initial Vital Signs:  Pulse/HCG Rate: 62ECG Heart Rate: (!) 46 Temp: 98.5 F (36.9 C) Resp: 18 BP: (!) 140/50 SpO2: 100 %  BMI: Estimated body mass index is 31.55 kg/m as calculated from the following:   Height as of this encounter: 5\' 1"  (1.549 m).   Weight as of this encounter: 167 lb (75.8 kg).  Risk Assessment: Allergies: Reviewed. She is allergic to dicyclomine; minoxidil; triamcinolone acetonide; valacyclovir; amantadines; cymbalta [duloxetine hcl]; erythromycin; hydrochlorothiazide; iodine; meloxicam; nexium [esomeprazole magnesium]; prilosec [omeprazole]; savella [milnacipran hcl]; cleocin [clindamycin hcl]; fluocinolone acetonide; and tramadol.  Allergy Precautions: None required Coagulopathies: Reviewed. None identified.  Blood-thinner therapy: None at this time Active Infection(s): Reviewed. None identified. Andrea Yang is afebrile  Site Confirmation: Andrea Yang was asked to confirm the procedure and laterality before marking the site Procedure checklist: Completed Consent: Before the procedure and under the influence of no sedative(s), amnesic(s), or anxiolytics, the patient was informed of the treatment options, risks and possible complications. To fulfill our ethical and legal obligations, as recommended by the American Medical Association's Code of Ethics, I have informed the patient of my clinical impression; the nature and purpose of the treatment or procedure; the risks, benefits, and possible complications of the intervention; the alternatives, including doing nothing; the risk(s) and benefit(s) of the alternative treatment(s) or procedure(s); and the risk(s) and benefit(s) of doing nothing. The patient was provided information about the general  risks and possible complications associated with the procedure. These may include, but are not limited to: failure to achieve desired goals, infection, bleeding, organ or nerve damage, allergic reactions, paralysis, and death. In addition, the patient  was informed of those risks and complications associated to Spine-related procedures, such as failure to decrease pain; infection (i.e.: Meningitis, epidural or intraspinal abscess); bleeding (i.e.: epidural hematoma, subarachnoid hemorrhage, or any other type of intraspinal or peri-dural bleeding); organ or nerve damage (i.e.: Any type of peripheral nerve, nerve root, or spinal cord injury) with subsequent damage to sensory, motor, and/or autonomic systems, resulting in permanent pain, numbness, and/or weakness of one or several areas of the body; allergic reactions; (i.e.: anaphylactic reaction); and/or death. Furthermore, the patient was informed of those risks and complications associated with the medications. These include, but are not limited to: allergic reactions (i.e.: anaphylactic or anaphylactoid reaction(s)); adrenal axis suppression; blood sugar elevation that in diabetics may result in ketoacidosis or comma; water retention that in patients with history of congestive heart failure may result in shortness of breath, pulmonary edema, and decompensation with resultant heart failure; weight gain; swelling or edema; medication-induced neural toxicity; particulate matter embolism and blood vessel occlusion with resultant organ, and/or nervous system infarction; and/or aseptic necrosis of one or more joints. Finally, the patient was informed that Medicine is not an exact science; therefore, there is also the possibility of unforeseen or unpredictable risks and/or possible complications that may result in a catastrophic outcome. The patient indicated having understood very clearly. We have given the patient no guarantees and we have made no promises. Enough  time was given to the patient to ask questions, all of which were answered to the patient's satisfaction. Andrea Yang has indicated that she wanted to continue with the procedure. Attestation: I, the ordering provider, attest that I have discussed with the patient the benefits, risks, side-effects, alternatives, likelihood of achieving goals, and potential problems during recovery for the procedure that I have provided informed consent. Date  Time: 06/20/2018  9:26 AM  Pre-Procedure Preparation:  Monitoring: As per clinic protocol. Respiration, ETCO2, SpO2, BP, heart rate and rhythm monitor placed and checked for adequate function Safety Precautions: Patient was assessed for positional comfort and pressure points before starting the procedure. Time-out: I initiated and conducted the "Time-out" before starting the procedure, as per protocol. The patient was asked to participate by confirming the accuracy of the "Time Out" information. Verification of the correct person, site, and procedure were performed and confirmed by me, the nursing staff, and the patient. "Time-out" conducted as per Joint Commission's Universal Protocol (UP.01.01.01). Time: 1019  Description of Procedure:          Target Area: The interlaminar space, initially targeting the lower laminar border of the superior vertebral body. Approach: Paramedial approach. Area Prepped: Entire Posterior Lumbar Region Prepping solution: ChloraPrep (2% chlorhexidine gluconate and 70% isopropyl alcohol) Safety Precautions: Aspiration looking for blood return was conducted prior to all injections. At no point did we inject any substances, as a needle was being advanced. No attempts were made at seeking any paresthesias. Safe injection practices and needle disposal techniques used. Medications properly checked for expiration dates. SDV (single dose vial) medications used. Description of the Procedure: Protocol guidelines were followed. The procedure  needle was introduced through the skin, ipsilateral to the reported pain, and advanced to the target area. Bone was contacted and the needle walked caudad, until the lamina was cleared. The epidural space was identified using "loss-of-resistance technique" with 2-3 ml of PF-NaCl (0.9% NSS), in a 5cc LOR glass syringe.  Vitals:   06/20/18 1025 06/20/18 1033 06/20/18 1043 06/20/18 1053  BP: (!) 117/56 (!) 155/72 (!) 152/72 (!) 153/74  Pulse: Marland Kitchen)  51     Resp: 15 10 12 11   Temp:  (!) 97.1 F (36.2 C)  (!) 97 F (36.1 C)  TempSrc:  Temporal  Temporal  SpO2: 99% 96% 95% 99%  Weight:      Height:        Start Time: 1019 hrs. End Time: 1025 hrs.  Materials:  Needle(s) Type: Epidural needle Gauge: 17G Length: 3.5-in Medication(s): Please see orders for medications and dosing details.  Imaging Guidance (Spinal):          Type of Imaging Technique: Fluoroscopy Guidance (Spinal) Indication(s): Assistance in needle guidance and placement for procedures requiring needle placement in or near specific anatomical locations not easily accessible without such assistance. Exposure Time: Please see nurses notes. Contrast: Before injecting any contrast, we confirmed that the patient did not have an allergy to iodine, shellfish, or radiological contrast. Once satisfactory needle placement was completed at the desired level, radiological contrast was injected. Contrast injected under live fluoroscopy. No contrast complications. See chart for type and volume of contrast used. Fluoroscopic Guidance: I was personally present during the use of fluoroscopy. "Tunnel Vision Technique" used to obtain the best possible view of the target area. Parallax error corrected before commencing the procedure. "Direction-depth-direction" technique used to introduce the needle under continuous pulsed fluoroscopy. Once target was reached, antero-posterior, oblique, and lateral fluoroscopic projection used confirm needle placement  in all planes. Images permanently stored in EMR. Interpretation: I personally interpreted the imaging intraoperatively. Adequate needle placement confirmed in multiple planes. Appropriate spread of contrast into desired area was observed. No evidence of afferent or efferent intravascular uptake. No intrathecal or subarachnoid spread observed. Permanent images saved into the patient's record.  Antibiotic Prophylaxis:   Anti-infectives (From admission, onward)   None     Indication(s): None identified  Post-operative Assessment:  Post-procedure Vital Signs:  Pulse/HCG Rate: (!) 51(!) 43 Temp: (!) 97 F (36.1 C) Resp: 11 BP: (!) 153/74 SpO2: 99 %  EBL: None  Complications: No immediate post-treatment complications observed by team, or reported by patient.  Note: The patient tolerated the entire procedure well. A repeat set of vitals were taken after the procedure and the patient was kept under observation following institutional policy, for this type of procedure. Post-procedural neurological assessment was performed, showing return to baseline, prior to discharge. The patient was provided with post-procedure discharge instructions, including a section on how to identify potential problems. Should any problems arise concerning this procedure, the patient was given instructions to immediately contact us, at any time, without hesitation. In any case, we plan to contact the patient by telephone for a follow-up status report regarding this interventional procedure.  Comments:  No additional relevant information.  Plan of Care    Imaging Orders     DG C-Arm 1-60 Min-No Report  Procedure Orders     Lumbar Epidural Injection  Medications ordered for procedure: Meds ordered this encounter  Medications  . iopamidol (ISOVUE-M) 41 % intrathecal injection 10 mL    Must be Myelogram-compatible. If not available, you may substitute with a water-soluble, non-ionic, hypoallergenic,  myelogram-compatible radiological contrast medium.  Marland Kitchen lidocaine (XYLOCAINE) 2 % (with pres) injection 400 mg  . midazolam (VERSED) 5 MG/5ML injection 1-2 mg    Make sure Flumazenil is available in the pyxis when using this medication. If oversedation occurs, administer 0.2 mg IV over 15 sec. If after 45 sec no response, administer 0.2 mg again over 1 min; may repeat at 1 min intervals; not  to exceed 4 doses (1 mg)  . fentaNYL (SUBLIMAZE) injection 25-50 mcg    Make sure Narcan is available in the pyxis when using this medication. In the event of respiratory depression (RR< 8/min): Titrate NARCAN (naloxone) in increments of 0.1 to 0.2 mg IV at 2-3 minute intervals, until desired degree of reversal.  . lactated ringers infusion 1,000 mL  . sodium chloride flush (NS) 0.9 % injection 2 mL  . ropivacaine (PF) 2 mg/mL (0.2%) (NAROPIN) injection 2 mL  . dexamethasone (DECADRON) injection 10 mg   Medications administered: We administered iopamidol, lidocaine, midazolam, fentaNYL, lactated ringers, sodium chloride flush, ropivacaine (PF) 2 mg/mL (0.2%), and dexamethasone.  See the medical record for exact dosing, route, and time of administration.  Disposition: Discharge home  Discharge Date & Time: 06/20/2018; 1101 hrs.   Physician-requested Follow-up: Return for post-procedure eval (2 wks), w/ Dr. Dossie Arbour.  Future Appointments  Date Time Provider [place]  07/03/2018 10:15 AM Milinda Pointer, MD [facility] None   Primary Care Physician: Renee Rival, NP Location: [facility] Note by: Gaspar Cola, MD Date: 06/20/2018; Time: 11:25 AM  Disclaimer:  Medicine is not an exact science. The only guarantee in medicine is that nothing is guaranteed. It is important to note that the decision to proceed with this intervention was based on the information collected from the patient. The Data and conclusions were drawn from the patient's questionnaire,  the interview, and the physical examination. Because the information was provided in large part by the patient, it cannot be guaranteed that it has not been purposely or unconsciously manipulated. Every effort has been made to obtain as much relevant data as possible for this evaluation. It is important to note that the conclusions that lead to this procedure are derived in large part from the available data. Always take into account that the treatment will also be dependent on availability of resources and existing treatment guidelines, considered by other Pain Management Practitioners as being common knowledge and practice, at the time of the intervention. For Medico-Legal purposes, it is also important to point out that variation in procedural techniques and pharmacological choices are the acceptable norm. The indications, contraindications, technique, and results of the above procedure should only be interpreted and judged by a Board-Certified Interventional Pain Specialist with extensive familiarity and expertise in the same exact procedure and technique.

## 2018-06-21 ENCOUNTER — Telehealth: Payer: Self-pay

## 2018-06-21 NOTE — Telephone Encounter (Signed)
Post procedure phone call.  Husband states that she is on the way to the doctor, but seems to be doing better because she is walking better.

## 2018-07-02 NOTE — Progress Notes (Signed)
Patient's Name: Andrea Yang  MRN: [id number]  Referring Provider: Renee Rival, NP  DOB: [birth year]  PCP: Renee Rival, NP  DOS: 07/03/2018  Note by: Gaspar Cola, MD  Service setting: Ambulatory outpatient  Specialty: Interventional Pain Management  Location: [facility] (AMB) Pain Management Facility    Patient type: Established   Primary Reason(s) for Visit: Encounter for post-procedure evaluation of chronic illness with mild to moderate exacerbation CC: Back Pain  HPI  Andrea Yang is a 67 y.o. year old, female patient, who comes today for a post-procedure evaluation. She has Central centrifugal scarring alopecia; Chronic abdominal pain; Chronic constipation; Female pattern hair loss; Fibromyalgia syndrome; Chronic low back pain (Primary Area of Pain) (Bilateral) (R>L) w/ sciatica (Bilateral); Chronic lower extremity pain (Secondary Area of Pain) (Bilateral) (R>L); Chronic neck pain (Tertiary Area of Pain) (Bilateral) (R>L); Chronic pain syndrome; Pharmacologic therapy; Disorder of skeletal system; Problems influencing health status; Chronic sacroiliac joint pain (Right); Chronic upper extremity pain (Fourth Area of Pain) (Bilateral) (R>L); Chronic low back pain (Primary Area of Pain) (Bilateral) (R>L); Chronic generalized pain; Chronic musculoskeletal pain; Neurogenic pain; Cervicalgia; Chronic sacroiliac joint sclerosis (Right); Elevated C-reactive protein (CRP); Osteoarthritis of lumbar spine; 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, L5-S1) (Bilateral); Lumbar facet syndrome (Bilateral) (R>L); Spondylosis without myelopathy or radiculopathy, lumbosacral region; Other specified dorsopathies, sacral and sacrococcygeal region; Lumbar Levoscoliosis; 4 mm Anterolisthesis of L4 over L5 on flexion; Lumbar spine instability (L4 over L5) (4 mm displacement on flexion); DDD (degenerative disc disease), lumbar; DDD (degenerative disc disease), cervical; Cervical facet hypertrophy  (Bilateral); 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 Her primarily concern today is the Back Pain  Pain Assessment: Location: Lower Back Radiating: pain radiaties down both legs Onset: More than a month ago Duration: Chronic pain Quality: Aching, Cramping, Burning Severity: 1 /10 (subjective, self-reported pain score)  Note: Reported level is compatible with observation.                         When using our objective Pain Scale, levels between 6 and 10/10 are said to belong in an emergency room, as it progressively worsens from a 6/10, described as severely limiting, requiring emergency care not usually available at an outpatient pain management facility. At a 6/10 level, communication becomes difficult and requires great effort. Assistance to reach the emergency department may be required. Facial flushing and profuse sweating along with potentially dangerous increases in heart rate and blood pressure will be evident. Effect on ADL: limits my daily activities Timing: Constant Modifying factors: ice, medications, hot bath BP: 133/78  HR: (!) 40  Andrea Yang comes in today for post-procedure evaluation.  Today he took a little bit of time to process this visit since the patient was more concerned about letting me know what other physicians on doing than what I could do for her.  Apparently she has been getting most of her treatments and studies in [place], but the problem with that is that none of it can be accessed through epic.  She indicated having had a study done, which sounded  like an MRI.  Unfortunately, she has a lot of difficulty trying to  explain the things that other physicians are doing for her or what kind of study she had done.  She is a very poor historian and even though she comes with her husband, which does help, it is still hard to get some of this information out of her.  Today she asked about the effects of the epidural steroid injections and even though we had previously gone over that, I did go over it again.  She also had with her the results of a venous Doppler which indicated that she does not have a DVT or venous reflux, however, even though she had a copy of the results which clearly stated this, she was under the impression that that she did have venous reflux.  In addition to this, the patient apparently has been seeing a vascular surgery who ordered some tests, but she was unable to tell me what Test it was and because it was ordered in [place], I have no way of looking into the chart and finding any information about it.  They have indicated that they will try to send me the results of the radiological test she had done last.  Eventually when we got around to going over the results of her recent diagnostic left-sided L4-5 lumbar epidural steroid injection #1, again it was difficult to interpret what she wanted to communicate.  In any case, it would seem that she obtained 100% relief of the pain for approximately 4 days.  She also indicates that it was not until this past Sunday (almost 2 weeks later) that she began to have the pain returned.  If it sounds as if she is contradicting herself it is because she is.  In any case, I was able to extract that she attained much better relief from this particular diagnostic injection then from the diagnostic lumbar facet blocks.  In view of this, I have offered the patient to complete a series of 3 left-sided L4-5 lumbar epidural steroid injections to see if we can obtain better and longer lasting benefit.  Further  details on both, my assessment(s), as well as the proposed treatment plan, please see below.  Post-Procedure Assessment  06/20/2018 Procedure: Diagnostic left L4-5 LESI #1 under fluoro and IV sedation. (Avoid Kenalog, use Decadron) Pre-procedure pain score:  5/10 Post-procedure pain score: 1/10 (> 50% relief) Influential Factors: BMI: 31.74 kg/m Intra-procedural challenges: None observed.         Assessment challenges: None detected.              Reported side-effects: None.        Post-procedural adverse reactions or complications: None reported         Sedation: Sedation provided. When no sedatives are used, the analgesic levels obtained are directly associated to the effectiveness of the local anesthetics. However, when sedation is provided, the level of analgesia obtained during the initial 1 hour following the intervention, is believed to be the result of a combination of factors. These factors may include, but are not limited to: 1. The effectiveness of the local anesthetics used. 2. The effects of the analgesic(s) and/or anxiolytic(s) used. 3. The degree of discomfort experienced by the patient at the time of the procedure. 4. The patients ability and reliability in recalling and recording the events. 5. The presence and influence of possible secondary gains and/or psychosocial factors. Reported result: Relief experienced during the 1st hour after the procedure: 100 % (Ultra-Short Term Relief)  Interpretative annotation: Clinically appropriate result. Analgesia during this period is likely to be Local Anesthetic and/or IV Sedative (Analgesic/Anxiolytic) related.          Effects of local anesthetic: The analgesic effects attained during this period are directly associated to the localized infiltration of local anesthetics and therefore cary significant diagnostic value as to the etiological location, or anatomical origin, of the pain. Expected duration of relief is directly  dependent on the pharmacodynamics of the local anesthetic used. Long-acting (4-6 hours) anesthetics used.  Reported result: Relief during the next 4 to 6 hour after the procedure: 100 % (Short-Term Relief)            Interpretative annotation: Clinically appropriate result. Analgesia during this period is likely to be Local Anesthetic-related.          Long-term benefit: Defined as the period of time past the expected duration of local anesthetics (1 hour for short-acting and 4-6 hours for long-acting). With the possible exception of prolonged sympathetic blockade from the local anesthetics, benefits during this period are typically attributed to, or associated with, other factors such as analgesic sensory neuropraxia, antiinflammatory effects, or beneficial biochemical changes provided by agents other than the local anesthetics.  Reported result: Extended relief following procedure: 100 %(for 4 days) (Long-Term Relief)            Interpretative annotation: Clinically possible results. Good relief. No permanent benefit expected. Inflammation plays a part in the etiology to the pain. Benefit believed to be steroid-related.  Current benefits: Defined as reported results that persistent at this point in time.   Analgesia: <50 %            Function: Somewhat improved ROM: Somewhat improved Interpretative annotation: Ongoing benefit. Therapeutic benefit observed. Results would suggest persistent aggravating factors.          Interpretation: Results would suggest a successful therapeutic intervention.                  Plan:  Proceed with treatment No.: 2          Laboratory Chemistry  Inflammation Markers (CRP: Acute Phase) (ESR: Chronic Phase) Lab Results  Component Value Date   CRP 7.6 (H) 12/11/2017   ESRSEDRATE 39 12/11/2017                         Rheumatology Markers No results found.  Renal Markers Lab Results  Component Value Date   BUN 16 12/11/2017   CREATININE 1.04 (H)  12/11/2017   BCR 15 12/11/2017   GFRAA 65 12/11/2017   GFRNONAA 56 (L) 12/11/2017                             Hepatic Markers Lab Results  Component Value Date   AST 22 12/11/2017   ALBUMIN 4.2 12/11/2017                        Neuropathy Markers Lab Results  Component Value Date   VITAMINB12 1,073 12/11/2017                        Hematology Parameters No results found.  CV Markers No results found.  Note: Lab results reviewed.  Recent Imaging Results   Results for orders placed in visit on 06/20/18  DG C-Arm 1-60 Min-No Report   Narrative Fluoroscopy was utilized by  the requesting physician.  No radiographic  interpretation.    Interpretation Report: Fluoroscopy was used during the procedure to assist with needle guidance. The images were interpreted intraoperatively by the requesting physician.  Meds   Current Outpatient Medications:  .  albuterol (PROVENTIL HFA;VENTOLIN HFA) 108 (90 Base) MCG/ACT inhaler, Inhale 2 puffs into the lungs every 6 (six) hours as needed for wheezing or shortness of breath., Disp: , Rfl:  .  amLODipine (NORVASC) 2.5 MG tablet, Take 2.5 mg by mouth daily., Disp: , Rfl:  .  calcium carbonate (OSCAL) 1500 (600 Ca) MG TABS tablet, Take by mouth 2 (two) times daily with a meal., Disp: , Rfl:  .  cetirizine (ZYRTEC) 10 MG tablet, Take 10 mg by mouth daily., Disp: , Rfl:  .  cholecalciferol (VITAMIN D) 400 units TABS tablet, Take 400 Units by mouth., Disp: , Rfl:  .  cyclobenzaprine (FLEXERIL) 10 MG tablet, Take 10 mg by mouth 3 (three) times daily as needed for muscle spasms., Disp: , Rfl:  .  diphenhydrAMINE (BENADRYL) 50 MG tablet, Take 50 mg by mouth at bedtime as needed for itching., Disp: , Rfl:  .  EVENING PRIMROSE OIL PO, Take by mouth daily at 6 (six) AM., Disp: , Rfl:  .  fluconazole (DIFLUCAN) 150 MG tablet, Take 150 mg by mouth daily. Reported on 09/27/2015, Disp: , Rfl:  .  fluticasone (FLONASE) 50 MCG/ACT nasal spray, Place 2 sprays  into both nostrils daily., Disp: , Rfl:  .  gabapentin (NEURONTIN) 300 MG capsule, Take 300 mg by mouth 3 (three) times daily., Disp: , Rfl:  .  Multiple Vitamin (MULTIVITAMIN) tablet, Take 1 tablet by mouth daily., Disp: , Rfl:  .  polyethylene glycol (MIRALAX / GLYCOLAX) packet, Take 1 packet by mouth as needed., Disp: , Rfl:  .  sucralfate (CARAFATE) 1 g tablet, Take 1 g by mouth daily., Disp: , Rfl:  .  tiZANidine (ZANAFLEX) 2 MG tablet, Take 1 tablet (2 mg total) by mouth every 8 (eight) hours as needed for muscle spasms., Disp: 90 tablet, Rfl: 0  ROS  Constitutional: Denies any fever or chills Gastrointestinal: No reported hemesis, hematochezia, vomiting, or acute GI distress Musculoskeletal: Denies any acute onset joint swelling, redness, loss of ROM, or weakness Neurological: No reported episodes of acute onset apraxia, aphasia, dysarthria, agnosia, amnesia, paralysis, loss of coordination, or loss of consciousness  Allergies  Andrea Yang is allergic to dicyclomine; minoxidil; triamcinolone acetonide; valacyclovir; amantadines; cymbalta [duloxetine hcl]; erythromycin; hydrochlorothiazide; iodine; meloxicam; nexium [esomeprazole magnesium]; prilosec [omeprazole]; savella [milnacipran hcl]; cleocin [clindamycin hcl]; fluocinolone acetonide; and tramadol.  [place]  Drug: Andrea Yang  reports that she does not use drugs. Alcohol:  reports that she does not drink alcohol. Tobacco:  reports that she has never smoked. She has never used smokeless tobacco. Medical:  has a past medical history of Chronic constipation, Fatty liver disease, nonalcoholic, Fibromyalgia, GERD (gastroesophageal reflux disease), History of hiatal hernia, and Hypertension. Surgical: Andrea Yang  has a past surgical history that includes Colonoscopy with propofol (N/A, 09/27/2015). Family: family history is not on file.  Constitutional Exam  General appearance: Well nourished, well developed, and well hydrated. In no  apparent acute distress Vitals:   07/03/18 1041  BP: 133/78  Pulse: (!) 40  Temp: 97.7 F (36.5 C)  SpO2: 100%  Weight: 168 lb (76.2 kg)   BMI Assessment: Estimated body mass index is 31.74 kg/m as calculated from the following:   Height as of 06/20/18: _0  (  1.549 m).   Weight as of this encounter: 168 lb (76.2 kg).  BMI interpretation table: BMI level Category Range association with higher incidence of chronic pain  <18 kg/m2 Underweight   18.5-24.9 kg/m2 Ideal body weight   25-29.9 kg/m2 Overweight Increased incidence by 20%  30-34.9 kg/m2 Obese (Class I) Increased incidence by 68%  35-39.9 kg/m2 Severe obesity (Class II) Increased incidence by 136%  >40 kg/m2 Extreme obesity (Class III) Increased incidence by 254%   Patient's current BMI Ideal Body weight  Body mass index is 31.74 kg/m. Ideal body weight: 47.8 kg (105 lb 6.1 oz) Adjusted ideal body weight: 59.2 kg (130 lb 6.8 oz)   BMI Readings from Last 4 Encounters:  07/03/18 31.74 kg/m  06/20/18 31.55 kg/m  06/03/18 32.12 kg/m  05/16/18 32.12 kg/m   Wt Readings from Last 4 Encounters:  07/03/18 168 lb (76.2 kg)  06/20/18 167 lb (75.8 kg)  06/03/18 170 lb (77.1 kg)  05/16/18 170 lb (77.1 kg)  Psych/Mental status: Alert, oriented x 3 (person, place, & time)       Eyes: PERLA Respiratory: No evidence of acute respiratory distress  Cervical Spine Area Exam  Skin & Axial Inspection: No masses, redness, edema, swelling, or associated skin lesions Alignment: Symmetrical Functional ROM: Unrestricted ROM      Stability: No instability detected Muscle Tone/Strength: Functionally intact. No obvious neuro-muscular anomalies detected. Sensory (Neurological): Unimpaired Palpation: No palpable anomalies              Upper Extremity (UE) Exam    Side: Right upper extremity  Side: Left upper extremity  Skin & Extremity Inspection: Skin color, temperature, and hair growth are WNL. No peripheral edema or cyanosis. No  masses, redness, swelling, asymmetry, or associated skin lesions. No contractures.  Skin & Extremity Inspection: Skin color, temperature, and hair growth are WNL. No peripheral edema or cyanosis. No masses, redness, swelling, asymmetry, or associated skin lesions. No contractures.  Functional ROM: Unrestricted ROM          Functional ROM: Unrestricted ROM          Muscle Tone/Strength: Functionally intact. No obvious neuro-muscular anomalies detected.  Muscle Tone/Strength: Functionally intact. No obvious neuro-muscular anomalies detected.  Sensory (Neurological): Unimpaired          Sensory (Neurological): Unimpaired          Palpation: No palpable anomalies              Palpation: No palpable anomalies              Provocative Test(s):  Phalen's test: deferred Tinel's test: deferred Apley's scratch test (touch opposite shoulder):  Action 1 (Across chest): deferred Action 2 (Overhead): deferred Action 3 (LB reach): deferred   Provocative Test(s):  Phalen's test: deferred Tinel's test: deferred Apley's scratch test (touch opposite shoulder):  Action 1 (Across chest): deferred Action 2 (Overhead): deferred Action 3 (LB reach): deferred    Thoracic Spine Area Exam  Skin & Axial Inspection: No masses, redness, or swelling Alignment: Symmetrical Functional ROM: Unrestricted ROM Stability: No instability detected Muscle Tone/Strength: Functionally intact. No obvious neuro-muscular anomalies detected. Sensory (Neurological): Unimpaired Muscle strength & Tone: No palpable anomalies  Lumbar Spine Area Exam  Skin & Axial Inspection: No masses, redness, or swelling Alignment: Symmetrical Functional ROM: Improved after treatment       Stability: No instability detected Muscle Tone/Strength: Functionally intact. No obvious neuro-muscular anomalies detected. Sensory (Neurological): Unimpaired Palpation: No palpable anomalies  Provocative Tests: Hyperextension/rotation test: deferred  today       Lumbar quadrant test (Kemp's test): deferred today       Lateral bending test: deferred today       Patrick's Maneuver: deferred today                   FABER test: deferred today                   S-I anterior distraction/compression test: deferred today         S-I lateral compression test: deferred today         S-I Thigh-thrust test: deferred today         S-I Gaenslen's test: deferred today          Gait & Posture Assessment  Ambulation: Unassisted Gait: Relatively normal for age and body habitus Posture: WNL   Lower Extremity Exam    Side: Right lower extremity  Side: Left lower extremity  Stability: No instability observed          Stability: No instability observed          Skin & Extremity Inspection: Skin color, temperature, and hair growth are WNL. No peripheral edema or cyanosis. No masses, redness, swelling, asymmetry, or associated skin lesions. No contractures.  Skin & Extremity Inspection: Skin color, temperature, and hair growth are WNL. No peripheral edema or cyanosis. No masses, redness, swelling, asymmetry, or associated skin lesions. No contractures.  Functional ROM: Unrestricted ROM                  Functional ROM: Unrestricted ROM                  Muscle Tone/Strength: Functionally intact. No obvious neuro-muscular anomalies detected.  Muscle Tone/Strength: Functionally intact. No obvious neuro-muscular anomalies detected.  Sensory (Neurological): Improved   Sensory (Neurological): Improved   DTR: Patellar: deferred today Achilles: deferred today Plantar: deferred today  DTR: Patellar: deferred today Achilles: deferred today Plantar: deferred today  Palpation: No palpable anomalies  Palpation: No palpable anomalies   Assessment  Primary Diagnosis & Pertinent Problem List: The primary encounter diagnosis was Chronic low back pain (Primary Area of Pain) (Bilateral) (R>L). 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  Status Diagnosis  Improving Improving Stable 1. Chronic low back pain (Primary Area of Pain) (Bilateral) (R>L)   2. Chronic lower extremity pain (Secondary Area of Pain) (Bilateral) (R>L)   3. Chronic neck pain (Tertiary Area of Pain) (Bilateral) (R>L)   4. Chronic upper extremity pain (Fourth Area of Pain) (Bilateral) (R>L)   5. DDD (degenerative disc disease), lumbar   6. Spondylosis without myelopathy or radiculopathy, lumbosacral region   7. 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 Lumbar facet syndrome (Bilateral) (R>L)   9. Lumbar facet arthropathy (L2-3, L3-4, L4-5, L5-S1)   10. Other specified dorsopathies, sacral and sacrococcygeal region   11. Chronic sacroiliac joint  pain (Right)   12. Chronic sacroiliac joint sclerosis (Right)   13. Other intervertebral disc degeneration, lumbar region     Problems updated and reviewed during this visit: No problems updated. Plan of Care  Pharmacotherapy (Medications Ordered): No orders of the defined types were placed in this encounter.  Medications administered today: Andrea Yang had no medications administered during this visit.   Procedure Orders     Lumbar Epidural Injection     Lumbar Epidural Injection Lab Orders  No laboratory test(s) ordered today     Imaging Orders     MR LUMBAR SPINE WO CONTRAST Referral Orders  No referral(s) requested today   Interventional management options: Planned, scheduled, and/or pending:   NOTE: Avoid Kenalog, use Decadron Diagnostic left L4-5 LESI #2 under fluoro and IV sedation. (Avoid Kenalog, use Decadron)    Considering:   Diagnostic bilateral lumbar facet block Possible bilateral lumbar facet RFA Diagnostic right-sided sacroiliac joint block Possible right-sided sacroiliac joint RFA DiagnosticrightCESI Diagnostic bilateral cervical facet nerve block Possible bilateral cervical facet RFA Diagnostic bilateral L4-5 transforaminal LESI Diagnostic right-sided L3-4 LESI Diagnostic trigger point injections Diagnosticlidocaine infusion   Palliative PRN treatment(s):   None at this time   Provider-requested follow-up: Return for Procedure (w/ sedation): (L) L4-5 LESI #2.  Future Appointments  Date Time Provider [place]  07/18/2018 12:45 PM Milinda Pointer, MD [facility] None   Primary Care Physician: Renee Rival, NP Location: [facility] Note by: Gaspar Cola, MD Date: 07/03/2018; Time: 12:14 PM

## 2018-07-03 ENCOUNTER — Encounter: Payer: Self-pay | Admitting: Pain Medicine

## 2018-07-03 ENCOUNTER — Other Ambulatory Visit: Payer: Self-pay

## 2018-07-03 ENCOUNTER — Ambulatory Visit: Payer: Medicare Other | Attending: Pain Medicine | Admitting: Pain Medicine

## 2018-07-03 VITALS — BP 133/78 | HR 40 | Temp 97.7°F | Wt 168.0 lb

## 2018-07-03 DIAGNOSIS — Z888 Allergy status to other drugs, medicaments and biological substances status: Secondary | ICD-10-CM | POA: Insufficient documentation

## 2018-07-03 DIAGNOSIS — M545 Low back pain, unspecified: Secondary | ICD-10-CM

## 2018-07-03 DIAGNOSIS — G8929 Other chronic pain: Secondary | ICD-10-CM | POA: Diagnosis not present

## 2018-07-03 DIAGNOSIS — Z79899 Other long term (current) drug therapy: Secondary | ICD-10-CM | POA: Insufficient documentation

## 2018-07-03 DIAGNOSIS — K449 Diaphragmatic hernia without obstruction or gangrene: Secondary | ICD-10-CM | POA: Diagnosis not present

## 2018-07-03 DIAGNOSIS — Z881 Allergy status to other antibiotic agents status: Secondary | ICD-10-CM | POA: Insufficient documentation

## 2018-07-03 DIAGNOSIS — M47816 Spondylosis without myelopathy or radiculopathy, lumbar region: Secondary | ICD-10-CM | POA: Insufficient documentation

## 2018-07-03 DIAGNOSIS — K219 Gastro-esophageal reflux disease without esophagitis: Secondary | ICD-10-CM | POA: Diagnosis not present

## 2018-07-03 DIAGNOSIS — M533 Sacrococcygeal disorders, not elsewhere classified: Secondary | ICD-10-CM | POA: Insufficient documentation

## 2018-07-03 DIAGNOSIS — M48061 Spinal stenosis, lumbar region without neurogenic claudication: Secondary | ICD-10-CM | POA: Insufficient documentation

## 2018-07-03 DIAGNOSIS — M79601 Pain in right arm: Secondary | ICD-10-CM | POA: Insufficient documentation

## 2018-07-03 DIAGNOSIS — M542 Cervicalgia: Secondary | ICD-10-CM

## 2018-07-03 DIAGNOSIS — G894 Chronic pain syndrome: Secondary | ICD-10-CM | POA: Insufficient documentation

## 2018-07-03 DIAGNOSIS — M5388 Other specified dorsopathies, sacral and sacrococcygeal region: Secondary | ICD-10-CM | POA: Diagnosis not present

## 2018-07-03 DIAGNOSIS — M79605 Pain in left leg: Secondary | ICD-10-CM

## 2018-07-03 DIAGNOSIS — I10 Essential (primary) hypertension: Secondary | ICD-10-CM | POA: Insufficient documentation

## 2018-07-03 DIAGNOSIS — M5116 Intervertebral disc disorders with radiculopathy, lumbar region: Secondary | ICD-10-CM | POA: Diagnosis not present

## 2018-07-03 DIAGNOSIS — M5136 Other intervertebral disc degeneration, lumbar region: Secondary | ICD-10-CM | POA: Diagnosis not present

## 2018-07-03 DIAGNOSIS — M79604 Pain in right leg: Secondary | ICD-10-CM | POA: Diagnosis not present

## 2018-07-03 DIAGNOSIS — M47817 Spondylosis without myelopathy or radiculopathy, lumbosacral region: Secondary | ICD-10-CM

## 2018-07-03 DIAGNOSIS — R7982 Elevated C-reactive protein (CRP): Secondary | ICD-10-CM | POA: Insufficient documentation

## 2018-07-03 DIAGNOSIS — M79602 Pain in left arm: Secondary | ICD-10-CM | POA: Insufficient documentation

## 2018-07-03 NOTE — Patient Instructions (Signed)
____________________________________________________________________________________________  Preparing for Procedure with Sedation  Instructions: . Oral Intake: Do not eat or drink anything for at least 8 hours prior to your procedure. . Transportation: Public transportation is not allowed. Bring an adult driver. The driver must be physically present in our waiting room before any procedure can be started. . Physical Assistance: Bring an adult physically capable of assisting you, in the event you need help. This adult should keep you company at home for at least 6 hours after the procedure. . Blood Pressure Medicine: Take your blood pressure medicine with a sip of water the morning of the procedure. . Blood thinners: Notify our staff if you are taking any blood thinners. Depending on which one you take, there will be specific instructions on how and when to stop it. . Diabetics on insulin: Notify the staff so that you can be scheduled 1st case in the morning. If your diabetes requires high dose insulin, take only  of your normal insulin dose the morning of the procedure and notify the staff that you have done so. . Preventing infections: Shower with an antibacterial soap the morning of your procedure. . Build-up your immune system: Take 1000 mg of Vitamin C with every meal (3 times a day) the day prior to your procedure. . Antibiotics: Inform the staff if you have a condition or reason that requires you to take antibiotics before dental procedures. . Pregnancy: If you are pregnant, call and cancel the procedure. . Sickness: If you have a cold, fever, or any active infections, call and cancel the procedure. . Arrival: You must be in the facility at least 30 minutes prior to your scheduled procedure. . Children: Do not bring children with you. . Dress appropriately: Bring dark clothing that you would not mind if they get stained. . Valuables: Do not bring any jewelry or valuables.  Procedure  appointments are reserved for interventional treatments only. . No Prescription Refills. . No medication changes will be discussed during procedure appointments. . No disability issues will be discussed.  Reasons to call and reschedule or cancel your procedure: (Following these recommendations will minimize the risk of a serious complication.) . Surgeries: Avoid having procedures within 2 weeks of any surgery. (Avoid for 2 weeks before or after any surgery). . Flu Shots: Avoid having procedures within 2 weeks of a flu shots or . (Avoid for 2 weeks before or after immunizations). . Barium: Avoid having a procedure within 7-10 days after having had a radiological study involving the use of radiological contrast. (Myelograms, Barium swallow or enema study). . Heart attacks: Avoid any elective procedures or surgeries for the initial 6 months after a "Myocardial Infarction" (Heart Attack). . Blood thinners: It is imperative that you stop these medications before procedures. Let us know if you if you take any blood thinner.  . Infection: Avoid procedures during or within two weeks of an infection (including chest colds or gastrointestinal problems). Symptoms associated with infections include: Localized redness, fever, chills, night sweats or profuse sweating, burning sensation when voiding, cough, congestion, stuffiness, runny nose, sore throat, diarrhea, nausea, vomiting, cold or Flu symptoms, recent or current infections. It is specially important if the infection is over the area that we intend to treat. . Heart and lung problems: Symptoms that may suggest an active cardiopulmonary problem include: cough, chest pain, breathing difficulties or shortness of breath, dizziness, ankle swelling, uncontrolled high or unusually low blood pressure, and/or palpitations. If you are experiencing any of these symptoms, cancel   your procedure and contact your primary care physician for an evaluation.  Remember:   Regular Business hours are:  Monday to Thursday 8:00 AM to 4:00 PM  Provider's Schedule: Griffin Gerrard, MD:  Procedure days: Tuesday and Thursday 7:30 AM to 4:00 PM  Bilal Lateef, MD:  Procedure days: Monday and Wednesday 7:30 AM to 4:00 PM ____________________________________________________________________________________________    

## 2018-07-08 ENCOUNTER — Encounter: Payer: Self-pay | Admitting: Pain Medicine

## 2018-07-08 DIAGNOSIS — M47812 Spondylosis without myelopathy or radiculopathy, cervical region: Secondary | ICD-10-CM | POA: Insufficient documentation

## 2018-07-08 DIAGNOSIS — R937 Abnormal findings on diagnostic imaging of other parts of musculoskeletal system: Secondary | ICD-10-CM | POA: Insufficient documentation

## 2018-07-16 ENCOUNTER — Other Ambulatory Visit: Payer: Self-pay | Admitting: Pain Medicine

## 2018-07-18 ENCOUNTER — Ambulatory Visit (HOSPITAL_BASED_OUTPATIENT_CLINIC_OR_DEPARTMENT_OTHER): Payer: Medicare Other | Admitting: Pain Medicine

## 2018-07-18 ENCOUNTER — Other Ambulatory Visit: Payer: Self-pay

## 2018-07-18 ENCOUNTER — Encounter: Payer: Self-pay | Admitting: Pain Medicine

## 2018-07-18 ENCOUNTER — Ambulatory Visit
Admission: RE | Admit: 2018-07-18 | Discharge: 2018-07-18 | Disposition: A | Discharge status: Home / Self Care | Payer: Medicare Other | Source: Ambulatory Visit | Attending: Pain Medicine | Admitting: Pain Medicine

## 2018-07-18 VITALS — BP 145/80 | HR 48 | Temp 97.0°F | Resp 13 | Ht 61.0 in | Wt 168.0 lb

## 2018-07-18 DIAGNOSIS — Z881 Allergy status to other antibiotic agents status: Secondary | ICD-10-CM | POA: Insufficient documentation

## 2018-07-18 DIAGNOSIS — M47816 Spondylosis without myelopathy or radiculopathy, lumbar region: Secondary | ICD-10-CM

## 2018-07-18 DIAGNOSIS — M5416 Radiculopathy, lumbar region: Secondary | ICD-10-CM

## 2018-07-18 DIAGNOSIS — Z888 Allergy status to other drugs, medicaments and biological substances status: Secondary | ICD-10-CM | POA: Insufficient documentation

## 2018-07-18 DIAGNOSIS — M532X6 Spinal instabilities, lumbar region: Secondary | ICD-10-CM

## 2018-07-18 DIAGNOSIS — F419 Anxiety disorder, unspecified: Secondary | ICD-10-CM | POA: Insufficient documentation

## 2018-07-18 DIAGNOSIS — M48061 Spinal stenosis, lumbar region without neurogenic claudication: Secondary | ICD-10-CM | POA: Diagnosis not present

## 2018-07-18 DIAGNOSIS — M5136 Other intervertebral disc degeneration, lumbar region: Secondary | ICD-10-CM | POA: Diagnosis not present

## 2018-07-18 DIAGNOSIS — M4316 Spondylolisthesis, lumbar region: Secondary | ICD-10-CM | POA: Insufficient documentation

## 2018-07-18 DIAGNOSIS — M4726 Other spondylosis with radiculopathy, lumbar region: Secondary | ICD-10-CM | POA: Insufficient documentation

## 2018-07-18 DIAGNOSIS — G8929 Other chronic pain: Secondary | ICD-10-CM

## 2018-07-18 DIAGNOSIS — M79604 Pain in right leg: Secondary | ICD-10-CM

## 2018-07-18 DIAGNOSIS — Z91040 Latex allergy status: Secondary | ICD-10-CM | POA: Insufficient documentation

## 2018-07-18 DIAGNOSIS — Z79899 Other long term (current) drug therapy: Secondary | ICD-10-CM | POA: Insufficient documentation

## 2018-07-18 DIAGNOSIS — M5116 Intervertebral disc disorders with radiculopathy, lumbar region: Secondary | ICD-10-CM | POA: Insufficient documentation

## 2018-07-18 DIAGNOSIS — M4310 Spondylolisthesis, site unspecified: Secondary | ICD-10-CM

## 2018-07-18 DIAGNOSIS — Z91041 Radiographic dye allergy status: Secondary | ICD-10-CM | POA: Insufficient documentation

## 2018-07-18 DIAGNOSIS — Z886 Allergy status to analgesic agent status: Secondary | ICD-10-CM | POA: Diagnosis not present

## 2018-07-18 DIAGNOSIS — M79605 Pain in left leg: Secondary | ICD-10-CM

## 2018-07-18 DIAGNOSIS — Z7951 Long term (current) use of inhaled steroids: Secondary | ICD-10-CM | POA: Diagnosis not present

## 2018-07-18 DIAGNOSIS — M5442 Lumbago with sciatica, left side: Secondary | ICD-10-CM

## 2018-07-18 DIAGNOSIS — M5441 Lumbago with sciatica, right side: Secondary | ICD-10-CM

## 2018-07-18 MED ORDER — ROPIVACAINE HCL 2 MG/ML IJ SOLN
2.0000 mL | Freq: Once | INTRAMUSCULAR | Status: AC
  Administered 2018-07-18: 2 mL via EPIDURAL
  Filled 2018-07-18: qty 10

## 2018-07-18 MED ORDER — LIDOCAINE HCL 2 % IJ SOLN
INTRAMUSCULAR | Status: AC
  Filled 2018-07-18: qty 20

## 2018-07-18 MED ORDER — FENTANYL CITRATE (PF) 100 MCG/2ML IJ SOLN
25.0000 ug | INTRAMUSCULAR | Status: DC | PRN
  Administered 2018-07-18: 50 ug via INTRAVENOUS
  Filled 2018-07-18: qty 2

## 2018-07-18 MED ORDER — MIDAZOLAM HCL 5 MG/5ML IJ SOLN
1.0000 mg | INTRAMUSCULAR | Status: DC | PRN
  Administered 2018-07-18: 1 mg via INTRAVENOUS
  Filled 2018-07-18: qty 5

## 2018-07-18 MED ORDER — DEXAMETHASONE SODIUM PHOSPHATE 10 MG/ML IJ SOLN
10.0000 mg | Freq: Once | INTRAMUSCULAR | Status: AC
  Administered 2018-07-18: 10 mg
  Filled 2018-07-18: qty 1

## 2018-07-18 MED ORDER — IOPAMIDOL (ISOVUE-M 200) INJECTION 41%
10.0000 mL | Freq: Once | INTRAMUSCULAR | Status: DC
  Filled 2018-07-18: qty 10

## 2018-07-18 MED ORDER — SODIUM CHLORIDE 0.9% FLUSH
2.0000 mL | Freq: Once | INTRAVENOUS | Status: AC
  Administered 2018-07-18: 2 mL

## 2018-07-18 MED ORDER — LACTATED RINGERS IV SOLN
1000.0000 mL | Freq: Once | INTRAVENOUS | Status: AC
  Administered 2018-07-18: 1000 mL via INTRAVENOUS

## 2018-07-18 MED ORDER — LIDOCAINE HCL 2 % IJ SOLN
20.0000 mL | Freq: Once | INTRAMUSCULAR | Status: AC
  Administered 2018-07-18: 400 mg
  Filled 2018-07-18: qty 40

## 2018-07-18 NOTE — Progress Notes (Signed)
Safety precautions to be maintained throughout the outpatient stay will include: orient to surroundings, keep bed in low position, maintain call bell within reach at all times, provide assistance with transfer out of bed and ambulation.  

## 2018-07-18 NOTE — Progress Notes (Signed)
Patient's Name: Andrea Yang  MRN: [id number]  Referring Provider: Renee Rival, NP  DOB: 06/09/1951  PCP: Renee Rival, NP  DOS: 07/18/2018  Note by: Gaspar Cola, MD  Service setting: Ambulatory outpatient  Specialty: Interventional Pain Management  Patient type: Established  Location: [facility] (AMB) Pain Management Facility  Visit type: Interventional Procedure   Primary Reason for Visit: Interventional Pain Management Treatment. CC: Leg Pain (bilateral)  Procedure:          Anesthesia, Analgesia, Anxiolysis:  Type: Therapeutic Inter-Laminar Epidural Steroid Injection  #2  Region: Lumbar Level: L4-5 Level. Laterality: Left-Sided Paramedial  Type: Moderate (Conscious) Sedation combined with Local Anesthesia Indication(s): Analgesia and Anxiety Route: Intravenous (IV) IV Access: Secured Sedation: Meaningful verbal contact was maintained at all times during the procedure  Local Anesthetic: Lidocaine 1-2%  Position: Prone with head of the table was raised to facilitate breathing.   Indications: 1. DDD (degenerative disc disease), lumbar   2. Osteoarthritis of lumbar spine   3. 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 Chronic lower extremity pain (Secondary Area of Pain) (Bilateral) (R>L)   5. Chronic low back pain (Primary Area of Pain) (Bilateral) (R>L) w/ sciatica (Bilateral)   6. Lumbar Grade 1 (4 mm) Anterolisthesis of L4/L5 on flexion   7. Lumbar radiculitis (Bilateral) (R>L)   8. Lumbar spine instability (L4 over L5) (4 mm displacement on flexion)    Pain Score: Pre-procedure: 1 /10 Post-procedure: 0-No pain/10  Pre-op Assessment:  Andrea Yang is a 67 y.o. (year old), female patient, seen today for interventional treatment. She  has a past surgical history that includes Colonoscopy with propofol (N/A, 09/27/2015). Andrea Yang has a current medication list which includes the following prescription(s):  albuterol, amlodipine, calcium carbonate, cetirizine, cholecalciferol, cyclobenzaprine, diphenhydramine, evening primrose oil, famotidine, fluconazole, fluticasone, gabapentin, metoprolol tartrate, polyethylene glycol, sucralfate, multivitamin, and tizanidine, and the following Facility-Administered Medications: fentanyl and midazolam. Her primarily concern today is the Leg Pain (bilateral)  Initial Vital Signs:  Pulse/HCG Rate: (!) 48ECG Heart Rate: (!) 44 Temp: 97.8 F (36.6 C) Resp: 14 BP: (!) 142/70 SpO2: 99 %  BMI: Estimated body mass index is 31.74 kg/m as calculated from the following:   Height as of this encounter: 5\' 1"  (1.549 m).   Weight as of this encounter: 168 lb (76.2 kg).  Risk Assessment: Allergies: Reviewed. She is allergic to dicyclomine; minoxidil; triamcinolone acetonide; valacyclovir; amantadines; cymbalta [duloxetine hcl]; erythromycin; hydrochlorothiazide; iodine; meloxicam; nexium [esomeprazole magnesium]; prilosec [omeprazole]; savella [milnacipran hcl]; cleocin [clindamycin hcl]; fluocinolone acetonide; latex; and tramadol.  Allergy Precautions: No iodine containing solutions or radiological contrast used.  Latex precautions taken. Coagulopathies: Reviewed. None identified.  Blood-thinner therapy: None at this time Active Infection(s): Reviewed. None identified. Andrea Yang is afebrile  Site Confirmation: Andrea Yang was asked to confirm the procedure and laterality before marking the site Procedure checklist: Completed Consent: Before the procedure and under the influence of no sedative(s), amnesic(s), or anxiolytics, the patient was informed of the treatment options, risks and possible complications. To fulfill our ethical and legal obligations, as recommended by the American Medical Association's Code of Ethics, I have informed the patient of my clinical impression; the nature and purpose of the treatment or procedure; the risks, benefits, and possible  complications of the intervention; the alternatives, including doing nothing; the risk(s) and benefit(s) of the alternative treatment(s) or procedure(s); and the risk(s) and benefit(s) of doing nothing. The patient was provided information  about the general risks and possible complications associated with the procedure. These may include, but are not limited to: failure to achieve desired goals, infection, bleeding, organ or nerve damage, allergic reactions, paralysis, and death. In addition, the patient was informed of those risks and complications associated to Spine-related procedures, such as failure to decrease pain; infection (i.e.: Meningitis, epidural or intraspinal abscess); bleeding (i.e.: epidural hematoma, subarachnoid hemorrhage, or any other type of intraspinal or peri-dural bleeding); organ or nerve damage (i.e.: Any type of peripheral nerve, nerve root, or spinal cord injury) with subsequent damage to sensory, motor, and/or autonomic systems, resulting in permanent pain, numbness, and/or weakness of one or several areas of the body; allergic reactions; (i.e.: anaphylactic reaction); and/or death. Furthermore, the patient was informed of those risks and complications associated with the medications. These include, but are not limited to: allergic reactions (i.e.: anaphylactic or anaphylactoid reaction(s)); adrenal axis suppression; blood sugar elevation that in diabetics may result in ketoacidosis or comma; water retention that in patients with history of congestive heart failure may result in shortness of breath, pulmonary edema, and decompensation with resultant heart failure; weight gain; swelling or edema; medication-induced neural toxicity; particulate matter embolism and blood vessel occlusion with resultant organ, and/or nervous system infarction; and/or aseptic necrosis of one or more joints. Finally, the patient was informed that Medicine is not an exact science; therefore, there is also  the possibility of unforeseen or unpredictable risks and/or possible complications that may result in a catastrophic outcome. The patient indicated having understood very clearly. We have given the patient no guarantees and we have made no promises. Enough time was given to the patient to ask questions, all of which were answered to the patient's satisfaction. Andrea Yang has indicated that she wanted to continue with the procedure. Attestation: I, the ordering provider, attest that I have discussed with the patient the benefits, risks, side-effects, alternatives, likelihood of achieving goals, and potential problems during recovery for the procedure that I have provided informed consent. Date  Time: 07/18/2018 12:38 PM  Pre-Procedure Preparation:  Monitoring: As per clinic protocol. Respiration, ETCO2, SpO2, BP, heart rate and rhythm monitor placed and checked for adequate function Safety Precautions: Patient was assessed for positional comfort and pressure points before starting the procedure. Time-out: I initiated and conducted the "Time-out" before starting the procedure, as per protocol. The patient was asked to participate by confirming the accuracy of the "Time Out" information. Verification of the correct person, site, and procedure were performed and confirmed by me, the nursing staff, and the patient. "Time-out" conducted as per Joint Commission's Universal Protocol (UP.01.01.01). Time: 1313  Description of Procedure:          Target Area: The interlaminar space, initially targeting the lower laminar border of the superior vertebral body. Approach: Paramedial approach. Area Prepped: Entire Posterior Lumbar Region Prepping solution: ChloraPrep (2% chlorhexidine gluconate and 70% isopropyl alcohol) Safety Precautions: Aspiration looking for blood return was conducted prior to all injections. At no point did we inject any substances, as a needle was being advanced. No attempts were made at  seeking any paresthesias. Safe injection practices and needle disposal techniques used. Medications properly checked for expiration dates. SDV (single dose vial) medications used. Description of the Procedure: Protocol guidelines were followed. The procedure needle was introduced through the skin, ipsilateral to the reported pain, and advanced to the target area. Bone was contacted and the needle walked caudad, until the lamina was cleared. The epidural space was identified using "loss-of-resistance  technique" with 2-3 ml of PF-NaCl (0.9% NSS), in a 5cc LOR glass syringe.  Vitals:   07/18/18 1317 07/18/18 1325 07/18/18 1335 07/18/18 1345  BP: 121/77 134/69 (!) 141/66 (!) 145/80  Pulse:      Resp: 14 16 13 13   Temp:  (!) 97 F (36.1 C)  (!) 97 F (36.1 C)  TempSrc:  Temporal  Temporal  SpO2: 95% 100% 100% 100%  Weight:      Height:        Start Time: 1313 hrs. End Time: 1317 hrs.  Materials:  Needle(s) Type: Epidural needle Gauge: 17G Length: 3.5-in Medication(s): Please see orders for medications and dosing details.  Imaging Guidance (Spinal):          Type of Imaging Technique: Fluoroscopy Guidance (Spinal) Indication(s): Assistance in needle guidance and placement for procedures requiring needle placement in or near specific anatomical locations not easily accessible without such assistance. Exposure Time: Please see nurses notes. Contrast: None used. Fluoroscopic Guidance: I was personally present during the use of fluoroscopy. "Tunnel Vision Technique" used to obtain the best possible view of the target area. Parallax error corrected before commencing the procedure. "Direction-depth-direction" technique used to introduce the needle under continuous pulsed fluoroscopy. Once target was reached, antero-posterior, oblique, and lateral fluoroscopic projection used confirm needle placement in all planes. Images permanently stored in EMR. Interpretation: No contrast injected. I  personally interpreted the imaging intraoperatively. Adequate needle placement confirmed in multiple planes. Permanent images saved into the patient's record.  Antibiotic Prophylaxis:   Anti-infectives (From admission, onward)   None     Indication(s): None identified  Post-operative Assessment:  Post-procedure Vital Signs:  Pulse/HCG Rate: (!) 48(!) 40 Temp: (!) 97 F (36.1 C) Resp: 13 BP: (!) 145/80 SpO2: 100 %  EBL: None  Complications: No immediate post-treatment complications observed by team, or reported by patient.  Note: The patient tolerated the entire procedure well. A repeat set of vitals were taken after the procedure and the patient was kept under observation following institutional policy, for this type of procedure. Post-procedural neurological assessment was performed, showing return to baseline, prior to discharge. The patient was provided with post-procedure discharge instructions, including a section on how to identify potential problems. Should any problems arise concerning this procedure, the patient was given instructions to immediately contact us, at any time, without hesitation. In any case, we plan to contact the patient by telephone for a follow-up status report regarding this interventional procedure.  Comments:  No additional relevant information.  Plan of Care   Imaging Orders     DG C-Arm 1-60 Min-No Report  Procedure Orders     Lumbar Epidural Injection  Medications ordered for procedure: Meds ordered this encounter  Medications  . DISCONTD: iopamidol (ISOVUE-M) 41 % intrathecal injection 10 mL    Must be Myelogram-compatible. If not available, you may substitute with a water-soluble, non-ionic, hypoallergenic, myelogram-compatible radiological contrast medium.  Marland Kitchen lidocaine (XYLOCAINE) 2 % (with pres) injection 400 mg  . midazolam (VERSED) 5 MG/5ML injection 1-2 mg    Make sure Flumazenil is available in the pyxis when using this medication. If  oversedation occurs, administer 0.2 mg IV over 15 sec. If after 45 sec no response, administer 0.2 mg again over 1 min; may repeat at 1 min intervals; not to exceed 4 doses (1 mg)  . fentaNYL (SUBLIMAZE) injection 25-50 mcg    Make sure Narcan is available in the pyxis when using this medication. In the event of respiratory  depression (RR< 8/min): Titrate NARCAN (naloxone) in increments of 0.1 to 0.2 mg IV at 2-3 minute intervals, until desired degree of reversal.  . lactated ringers infusion 1,000 mL  . sodium chloride flush (NS) 0.9 % injection 2 mL  . ropivacaine (PF) 2 mg/mL (0.2%) (NAROPIN) injection 2 mL  . dexamethasone (DECADRON) injection 10 mg   Medications administered: We administered lidocaine, midazolam, fentaNYL, lactated ringers, sodium chloride flush, ropivacaine (PF) 2 mg/mL (0.2%), and dexamethasone.  See the medical record for exact dosing, route, and time of administration.  Disposition: Discharge home  Discharge Date & Time: 07/18/2018; 1349 hrs.   Physician-requested Follow-up: Return for post-procedure eval (2 wks), w/ Dr. Dossie Arbour.  Future Appointments  Date Time Provider [place]  08/19/2018 10:15 AM Milinda Pointer, MD [facility] None   Primary Care Physician: Renee Rival, NP Location: [facility] Outpatient Pain Management Facility Note by: Gaspar Cola, MD Date: 07/18/2018; Time: 2:19 PM  Disclaimer:  Medicine is not an Chief Strategy Officer. The only guarantee in medicine is that nothing is guaranteed. It is important to note that the decision to proceed with this intervention was based on the information collected from the patient. The Data and conclusions were drawn from the patient's questionnaire, the interview, and the physical examination. Because the information was provided in large part by the patient, it cannot be guaranteed that it has not been purposely or unconsciously manipulated. Every effort has been made to obtain as much relevant data  as possible for this evaluation. It is important to note that the conclusions that lead to this procedure are derived in large part from the available data. Always take into account that the treatment will also be dependent on availability of resources and existing treatment guidelines, considered by other Pain Management Practitioners as being common knowledge and practice, at the time of the intervention. For Medico-Legal purposes, it is also important to point out that variation in procedural techniques and pharmacological choices are the acceptable norm. The indications, contraindications, technique, and results of the above procedure should only be interpreted and judged by a Board-Certified Interventional Pain Specialist with extensive familiarity and expertise in the same exact procedure and technique.

## 2018-07-18 NOTE — Patient Instructions (Signed)

## 2018-07-19 ENCOUNTER — Telehealth: Payer: Self-pay | Admitting: *Deleted

## 2018-07-19 NOTE — Telephone Encounter (Signed)
Attempted to call for post procedure follow-up. Unable to leave a message. 

## 2018-08-18 NOTE — Progress Notes (Signed)
Patient's Name: Andrea Yang  MRN: [id number]  Referring Provider: Renee Rival, NP  DOB: 03-01-1951  PCP: Renee Rival, NP  DOS: 08/19/2018  Note by: Gaspar Cola, MD  Service setting: Ambulatory outpatient  Specialty: Interventional Pain Management  Location: [facility] (AMB) Pain Management Facility    Patient type: Established   Primary Reason(s) for Visit: Encounter for post-procedure evaluation of chronic illness with mild to moderate exacerbation CC: Back Pain (lumbar bilateral); Leg Pain (bilateral ); and Arm Pain (bilateral )  HPI  Andrea Yang is a 68 y.o. year old, female patient, who comes today for a post-procedure evaluation. She has Central centrifugal scarring alopecia; Chronic abdominal pain; Chronic constipation; Female pattern hair loss; Fibromyalgia syndrome; Chronic low back pain (Primary Area of Pain) (Bilateral) (R>L) w/ sciatica (Bilateral); Chronic lower extremity pain (Secondary Area of Pain) (Bilateral) (R>L); Chronic neck pain (Tertiary Area of Pain) (Bilateral) (R>L); Chronic pain syndrome; Pharmacologic therapy; Disorder of skeletal system; Problems influencing health status; Chronic sacroiliac joint pain (Right); Chronic upper extremity pain (Fourth Area of Pain) (Bilateral) (R>L); Chronic low back pain (Primary Area of Pain) (Bilateral) (R>L); Chronic generalized pain; Chronic musculoskeletal pain; Neurogenic pain; Cervicalgia; Chronic sacroiliac joint sclerosis (Right); Elevated C-reactive protein (CRP); Osteoarthritis of lumbar spine; 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, L5-S1) (Bilateral); Lumbar facet syndrome (Bilateral) (R>L); Spondylosis without myelopathy or radiculopathy, lumbosacral region; Other specified dorsopathies, sacral and sacrococcygeal region; Lumbar Levoscoliosis; Lumbar Grade 1 (4 mm) Anterolisthesis of L4/L5 on flexion; Lumbar spine instability (L4 over L5) (4 mm displacement on flexion); DDD (degenerative disc disease), lumbar;  DDD (degenerative disc disease), cervical; Cervical facet hypertrophy (Bilateral); 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); Cervical facet syndrome (Bilateral) (R>L); Cervical radiculitis (Bilateral) (R>L); Lumbar radiculitis (Bilateral) (R>L); Decreased glomerular filtration rate (GFR); Other intervertebral disc degeneration, lumbar region; Lumbar facet arthropathy (L2-3, L3-4, L4-5, L5-S1); Lumbar foraminal stenosis (Multilevel) (Bilateral: L2-3, L3-4, L4-5, L5-S1) (severe at right L4-5); Abnormal MRI, cervical spine (05/15/2018); Cervical facet arthropathy (Multilevel) (Bilateral); Abnormal MRI, lumbar spine (05/15/2018); History of Iodine Allergy (Precautions); History of allergy to contrast; Latex precautions, history of latex allergy; and Insomnia secondary to chronic pain on their problem list. Her primarily concern today is the Back Pain (lumbar bilateral); Leg Pain (bilateral ); and Arm Pain (bilateral )  Pain Assessment: Location: Left, Right Leg Radiating: pain is not as severe in the legs since procedure.  when she is on her feet the swelling becomes worse and creates the pain.  back pain into the legs down to the ankles.  Onset: More than a month ago Duration: Chronic pain Quality: Throbbing, Constant, Aching, Sore Severity: 1 /10 (subjective, self-reported pain score)  Note: Reported level is compatible with observation.                         When using our objective Pain Scale, levels between 6 and 10/10 are said to belong in an emergency room, as it progressively worsens from a 6/10, described as severely limiting, requiring emergency care not usually available at an outpatient pain management facility. At a 6/10 level, communication becomes difficult and requires great effort. Assistance to reach the emergency department may be required. Facial flushing and profuse sweating along with potentially dangerous increases in heart rate and blood pressure will be  evident. Effect on ADL: staying on her feet too long will cause the swelling and pain.  is able to small household chores.  Timing: Constant  Modifying factors: procedures, getting off feet, laying down, topical analgesic.  patient was prescribed zanaflex but this caused a frontal headache.  gabapentin.  BP: (!) 147/57  HR: (!) 44  Andrea Yang comes in today for post-procedure evaluation.  The patient indicates that she has slowly increased her gabapentin to 300 mg twice a day and 600 mg at bedtime and days seems to work better.  However, she was unable to tolerate the tizanidine, due to frontal headaches.  Further details on both, my assessment(s), as well as the proposed treatment plan, please see below.  Post-Procedure Assessment  07/18/2018 Procedure: Diagnostic left L4-5 LESI #2under fluoro and IV sedation. (using Decadron)  Pre-procedure pain score:  1/10 Post-procedure pain score: 0/10 (100% relief) Influential Factors: BMI: 31.74 kg/m Intra-procedural challenges: None observed.         Assessment challenges: None detected.              Reported side-effects: None.        Post-procedural adverse reactions or complications: None reported         Sedation: Sedation provided. When no sedatives are used, the analgesic levels obtained are directly associated to the effectiveness of the local anesthetics. However, when sedation is provided, the level of analgesia obtained during the initial 1 hour following the intervention, is believed to be the result of a combination of factors. These factors may include, but are not limited to: 1. The effectiveness of the local anesthetics used. 2. The effects of the analgesic(s) and/or anxiolytic(s) used. 3. The degree of discomfort experienced by the patient at the time of the procedure. 4. The patients ability and reliability in recalling and recording the events. 5. The presence and influence of possible secondary gains and/or psychosocial  factors. Reported result: Relief experienced during the 1st hour after the procedure: 100 % (Ultra-Short Term Relief)            Interpretative annotation: Clinically appropriate result. Analgesia during this period is likely to be Local Anesthetic and/or IV Sedative (Analgesic/Anxiolytic) related.          Effects of local anesthetic: The analgesic effects attained during this period are directly associated to the localized infiltration of local anesthetics and therefore cary significant diagnostic value as to the etiological location, or anatomical origin, of the pain. Expected duration of relief is directly dependent on the pharmacodynamics of the local anesthetic used. Long-acting (4-6 hours) anesthetics used.  Reported result: Relief during the next 4 to 6 hour after the procedure: 90 % (Short-Term Relief)            Interpretative annotation: Clinically appropriate result. Analgesia during this period is likely to be Local Anesthetic-related.          Long-term benefit: Defined as the period of time past the expected duration of local anesthetics (1 hour for short-acting and 4-6 hours for long-acting). With the possible exception of prolonged sympathetic blockade from the local anesthetics, benefits during this period are typically attributed to, or associated with, other factors such as analgesic sensory neuropraxia, antiinflammatory effects, or beneficial biochemical changes provided by agents other than the local anesthetics.  Reported result: Extended relief following procedure: 75 % (Long-Term Relief)            Interpretative annotation: Clinically possible results. Good relief. No permanent benefit expected. Inflammation plays a part in the etiology to the pain.          Current benefits: Defined as reported results that persistent at this  point in time.   Analgesia: 75-100 % Andrea Yang reports improvement of axial and extremity symptoms. Function: Andrea Yang reports improvement in  function ROM: Andrea Yang reports improvement in ROM Interpretative annotation: Ongoing benefit. Therapeutic success. Effective therapeutic approach.          Interpretation: Results would suggest a successful diagnostic intervention.                  Plan:  Please see "Plan of Care" for details.                Laboratory Chemistry  Inflammation Markers (CRP: Acute Phase) (ESR: Chronic Phase) Lab Results  Component Value Date   CRP 7.6 (H) 12/11/2017   ESRSEDRATE 39 12/11/2017                         Rheumatology Markers No results found.  Renal Markers Lab Results  Component Value Date   BUN 16 12/11/2017   CREATININE 1.04 (H) 12/11/2017   BCR 15 12/11/2017   GFRAA 65 12/11/2017   GFRNONAA 56 (L) 12/11/2017                             Hepatic Markers Lab Results  Component Value Date   AST 22 12/11/2017   ALBUMIN 4.2 12/11/2017                        Neuropathy Markers Lab Results  Component Value Date   VITAMINB12 1,073 12/11/2017                        Hematology Parameters No results found.  CV Markers No results found.  Note: Lab results reviewed.  Recent Imaging Results   Results for orders placed in visit on 07/18/18  DG C-Arm 1-60 Min-No Report   Narrative Fluoroscopy was utilized by the requesting physician.  No radiographic  interpretation.    Interpretation Report: Fluoroscopy was used during the procedure to assist with needle guidance. The images were interpreted intraoperatively by the requesting physician.  Meds   Current Outpatient Medications:  .  albuterol (PROVENTIL HFA;VENTOLIN HFA) 108 (90 Base) MCG/ACT inhaler, Inhale 2 puffs into the lungs every 6 (six) hours as needed for wheezing or shortness of breath., Disp: , Rfl:  .  amLODipine (NORVASC) 2.5 MG tablet, Take 2.5 mg by mouth daily., Disp: , Rfl:  .  calcium carbonate (OSCAL) 1500 (600 Ca) MG TABS tablet, Take by mouth 2 (two) times daily with a meal., Disp: , Rfl:  .   cetirizine (ZYRTEC) 10 MG tablet, Take 10 mg by mouth daily., Disp: , Rfl:  .  cholecalciferol (VITAMIN D) 400 units TABS tablet, Take 400 Units by mouth., Disp: , Rfl:  .  diphenhydrAMINE (BENADRYL) 50 MG tablet, Take 50 mg by mouth at bedtime as needed for itching., Disp: , Rfl:  .  EVENING PRIMROSE OIL PO, Take by mouth daily at 6 (six) AM., Disp: , Rfl:  .  famotidine (PEPCID) 10 MG tablet, Take 10 mg by mouth 2 (two) times daily., Disp: , Rfl:  .  fluconazole (DIFLUCAN) 150 MG tablet, Take 150 mg by mouth daily. Reported on 09/27/2015, Disp: , Rfl:  .  fluticasone (FLONASE) 50 MCG/ACT nasal spray, Place 2 sprays into both nostrils daily., Disp: , Rfl:  .  gabapentin (NEURONTIN) 300 MG capsule, Take 1  cap (300 mg) in AM & Noon, and 2 caps (600 mg) HS, Disp: 120 capsule, Rfl: 5 .  Magnesium Oxide 500 MG (LAX) TABS, Take 500 mg by mouth at bedtime., Disp: , Rfl:  .  metoprolol tartrate (LOPRESSOR) 25 MG tablet, Take 25 mg by mouth 2 (two) times daily. 1/2 tab twice daily, Disp: , Rfl:  .  Multiple Vitamin (MULTIVITAMIN) tablet, Take 1 tablet by mouth daily., Disp: , Rfl:  .  polyethylene glycol (MIRALAX / GLYCOLAX) packet, Take 1 packet by mouth as needed., Disp: , Rfl:  .  sucralfate (CARAFATE) 1 g tablet, Take 1 g by mouth daily., Disp: , Rfl:  .  Melatonin 10 MG CAPS, Take 20 mg by mouth at bedtime as needed., Disp: 30 capsule, Rfl: 5  ROS  Constitutional: Denies any fever or chills Gastrointestinal: No reported hemesis, hematochezia, vomiting, or acute GI distress Musculoskeletal: Denies any acute onset joint swelling, redness, loss of ROM, or weakness Neurological: No reported episodes of acute onset apraxia, aphasia, dysarthria, agnosia, amnesia, paralysis, loss of coordination, or loss of consciousness  Allergies  Andrea Yang is allergic to dicyclomine; minoxidil; triamcinolone acetonide; valacyclovir; amantadines; cymbalta [duloxetine hcl]; erythromycin; hydrochlorothiazide; iodine;  meloxicam; nexium [esomeprazole magnesium]; prilosec [omeprazole]; savella [milnacipran hcl]; cleocin [clindamycin hcl]; fluocinolone acetonide; latex; and tramadol.  [place]  Drug: Andrea Yang  reports no history of drug use. Alcohol:  reports no history of alcohol use. Tobacco:  reports that she has never smoked. She has never used smokeless tobacco. Medical:  has a past medical history of Chronic constipation, Fatty liver disease, nonalcoholic, Fibromyalgia, GERD (gastroesophageal reflux disease), History of hiatal hernia, and Hypertension. Surgical: Andrea Yang  has a past surgical history that includes Colonoscopy with propofol (N/A, 09/27/2015). Family: family history is not on file.  Constitutional Exam  General appearance: Well nourished, well developed, and well hydrated. In no apparent acute distress Vitals:   08/19/18 1017  BP: (!) 147/57  Pulse: (!) 44  Resp: 16  Temp: 97.6 F (36.4 C)  TempSrc: Oral  SpO2: 100%  Weight: 168 lb (76.2 kg)  Height: 5' 1" (1.549 m)   BMI Assessment: Estimated body mass index is 31.74 kg/m as calculated from the following:   Height as of this encounter: 5' 1" (1.549 m).   Weight as of this encounter: 168 lb (76.2 kg).  BMI interpretation table: BMI level Category Range association with higher incidence of chronic pain  <18 kg/m2 Underweight   18.5-24.9 kg/m2 Ideal body weight   25-29.9 kg/m2 Overweight Increased incidence by 20%  30-34.9 kg/m2 Obese (Class I) Increased incidence by 68%  35-39.9 kg/m2 Severe obesity (Class II) Increased incidence by 136%  >40 kg/m2 Extreme obesity (Class III) Increased incidence by 254%   Patient's current BMI Ideal Body weight  Body mass index is 31.74 kg/m. Ideal body weight: 47.8 kg (105 lb 6.1 oz) Adjusted ideal body weight: 59.2 kg (130 lb 6.8 oz)   BMI Readings from Last 4 Encounters:  08/19/18 31.74 kg/m  07/18/18 31.74 kg/m  07/03/18 31.74 kg/m  06/20/18 31.55 kg/m   Wt Readings from Last  4 Encounters:  08/19/18 168 lb (76.2 kg)  07/18/18 168 lb (76.2 kg)  07/03/18 168 lb (76.2 kg)  06/20/18 167 lb (75.8 kg)  Psych/Mental status: Alert, oriented x 3 (person, place, & time)       Eyes: PERLA Respiratory: No evidence of acute respiratory distress  Cervical Spine Area Exam  Skin & Axial Inspection: No masses, redness,  edema, swelling, or associated skin lesions Alignment: Symmetrical Functional ROM: Unrestricted ROM      Stability: No instability detected Muscle Tone/Strength: Functionally intact. No obvious neuro-muscular anomalies detected. Sensory (Neurological): Unimpaired Palpation: No palpable anomalies              Upper Extremity (UE) Exam    Side: Right upper extremity  Side: Left upper extremity  Skin & Extremity Inspection: Skin color, temperature, and hair growth are WNL. No peripheral edema or cyanosis. No masses, redness, swelling, asymmetry, or associated skin lesions. No contractures.  Skin & Extremity Inspection: Skin color, temperature, and hair growth are WNL. No peripheral edema or cyanosis. No masses, redness, swelling, asymmetry, or associated skin lesions. No contractures.  Functional ROM: Unrestricted ROM          Functional ROM: Unrestricted ROM          Muscle Tone/Strength: Functionally intact. No obvious neuro-muscular anomalies detected.  Muscle Tone/Strength: Functionally intact. No obvious neuro-muscular anomalies detected.  Sensory (Neurological): Unimpaired          Sensory (Neurological): Unimpaired          Palpation: No palpable anomalies              Palpation: No palpable anomalies              Provocative Test(s):  Phalen's test: deferred Tinel's test: deferred Apley's scratch test (touch opposite shoulder):  Action 1 (Across chest): deferred Action 2 (Overhead): deferred Action 3 (LB reach): deferred   Provocative Test(s):  Phalen's test: deferred Tinel's test: deferred Apley's scratch test (touch opposite shoulder):  Action 1  (Across chest): deferred Action 2 (Overhead): deferred Action 3 (LB reach): deferred    Thoracic Spine Area Exam  Skin & Axial Inspection: No masses, redness, or swelling Alignment: Symmetrical Functional ROM: Unrestricted ROM Stability: No instability detected Muscle Tone/Strength: Functionally intact. No obvious neuro-muscular anomalies detected. Sensory (Neurological): Unimpaired Muscle strength & Tone: No palpable anomalies  Lumbar Spine Area Exam  Skin & Axial Inspection: No masses, redness, or swelling Alignment: Symmetrical Functional ROM: Unrestricted ROM       Stability: No instability detected Muscle Tone/Strength: Functionally intact. No obvious neuro-muscular anomalies detected. Sensory (Neurological): Unimpaired Palpation: No palpable anomalies       Provocative Tests: Hyperextension/rotation test: deferred today       Lumbar quadrant test (Kemp's test): deferred today       Lateral bending test: deferred today       Patrick's Maneuver: deferred today                   FABER* test: deferred today                   S-I anterior distraction/compression test: deferred today         S-I lateral compression test: deferred today         S-I Thigh-thrust test: deferred today         S-I Gaenslen's test: deferred today         *(Flexion, ABduction and External Rotation)  Gait & Posture Assessment  Ambulation: Unassisted Gait: Relatively normal for age and body habitus Posture: WNL   Lower Extremity Exam    Side: Right lower extremity  Side: Left lower extremity  Stability: No instability observed          Stability: No instability observed          Skin & Extremity Inspection: Skin color, temperature, and  hair growth are WNL. No peripheral edema or cyanosis. No masses, redness, swelling, asymmetry, or associated skin lesions. No contractures.  Skin & Extremity Inspection: Skin color, temperature, and hair growth are WNL. No peripheral edema or cyanosis. No masses,  redness, swelling, asymmetry, or associated skin lesions. No contractures.  Functional ROM: Unrestricted ROM                  Functional ROM: Unrestricted ROM                  Muscle Tone/Strength: Functionally intact. No obvious neuro-muscular anomalies detected.  Muscle Tone/Strength: Functionally intact. No obvious neuro-muscular anomalies detected.  Sensory (Neurological): Unimpaired        Sensory (Neurological): Unimpaired        DTR: Patellar: deferred today Achilles: deferred today Plantar: deferred today  DTR: Patellar: deferred today Achilles: deferred today Plantar: deferred today  Palpation: No palpable anomalies  Palpation: No palpable anomalies   Assessment  Primary Diagnosis & Pertinent Problem List: The primary encounter diagnosis was Chronic low back pain (Primary Area of Pain) (Bilateral) (R>L) w/ sciatica (Bilateral). Diagnoses of Chronic lower extremity pain (Secondary Area of Pain) (Bilateral) (R>L), DDD (degenerative disc disease), lumbar, Lumbar foraminal stenosis (Multilevel) (Bilateral: L2-3, L3-4, L4-5, L5-S1) (severe at right L4-5), Lumbar Grade 1 (4 mm) Anterolisthesis of L4/L5 on flexion, Lumbar radiculitis (Bilateral) (R>L), Neurogenic pain, and Insomnia secondary to chronic pain were also pertinent to this visit.  Status Diagnosis  Controlled Controlled Stable 1. Chronic low back pain (Primary Area of Pain) (Bilateral) (R>L) w/ sciatica (Bilateral)   2. Chronic lower extremity pain (Secondary Area of Pain) (Bilateral) (R>L)   3. DDD (degenerative disc disease), lumbar   4. 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 Lumbar Grade 1 (4 mm) Anterolisthesis of L4/L5 on flexion   6. Lumbar radiculitis (Bilateral) (R>L)   7. Neurogenic pain   8. Insomnia secondary to chronic pain     Problems updated and reviewed during this visit: Problem  Insomnia Secondary to Chronic Pain   Plan of Care   Pharmacotherapy (Medications Ordered): Meds ordered this encounter  Medications  . gabapentin (NEURONTIN) 300 MG capsule    Sig: Take 1 cap (300 mg) in AM & Noon, and 2 caps (600 mg) HS    Dispense:  120 capsule    Refill:  5    Do not place medication on "Automatic Refill". Fill one day early if pharmacy is closed on scheduled refill date.  . Melatonin 10 MG CAPS    Sig: Take 20 mg by mouth at bedtime as needed.    Dispense:  30 capsule    Refill:  5    Do not add to the electronic "Automatic Refill" notification system. Patient may have prescription filled one day early if pharmacy is closed on scheduled refill date.   Medications administered today: Andrea Yang had no medications administered during this visit.   Procedure Orders     Lumbar Epidural Injection Lab Orders  No laboratory test(s) ordered today   Imaging Orders  No imaging studies ordered today   Referral Orders  No referral(s) requested today   Interventional management options: Planned, scheduled, and/or pending:   NOTE: KENALOG ALLERGY (Use Decadron) None at this time.   Considering:   Diagnostic bilateral lumbar facet block Possible bilateral lumbar facet RFA Diagnostic right-sided sacroiliac joint block Possible right-sided sacroiliac joint RFA DiagnosticrightCESI Diagnostic bilateral cervical facet nerve  block  Possible bilateral cervical facet RFA Diagnostic bilateral L4-5 transforaminal LESI Therapeutic left-sided L4-5 LESI #3  Diagnostic right-sided L3-4 LESI Diagnostic trigger point injections Diagnosticlidocaine infusion   Palliative PRN treatment(s):   Palliative/therapeutic left L4-5 LESI #3under fluoro and IV sedation. (using Decadron)    Provider-requested follow-up: Return in about 6 months (around 02/17/2019) for PRN Procedure, Med-Mgmt, w/ Dionisio David, NP.  Future Appointments  Date Time Provider [place]  02/17/2019  9:00 AM Vevelyn Francois, NP [facility]  None   Primary Care Physician: Renee Rival, NP Location: [facility] Note by: Gaspar Cola, MD Date: 08/19/2018; Time: 11:23 AM

## 2018-08-19 ENCOUNTER — Ambulatory Visit: Payer: Medicare Other | Attending: Pain Medicine | Admitting: Pain Medicine

## 2018-08-19 ENCOUNTER — Encounter: Payer: Self-pay | Admitting: Pain Medicine

## 2018-08-19 VITALS — BP 147/57 | HR 44 | Temp 97.6°F | Resp 16 | Ht 61.0 in | Wt 168.0 lb

## 2018-08-19 DIAGNOSIS — G8929 Other chronic pain: Secondary | ICD-10-CM | POA: Diagnosis present

## 2018-08-19 DIAGNOSIS — G4701 Insomnia due to medical condition: Secondary | ICD-10-CM | POA: Diagnosis present

## 2018-08-19 DIAGNOSIS — M792 Neuralgia and neuritis, unspecified: Secondary | ICD-10-CM | POA: Insufficient documentation

## 2018-08-19 DIAGNOSIS — M79604 Pain in right leg: Secondary | ICD-10-CM

## 2018-08-19 DIAGNOSIS — M5442 Lumbago with sciatica, left side: Secondary | ICD-10-CM | POA: Diagnosis present

## 2018-08-19 DIAGNOSIS — M5441 Lumbago with sciatica, right side: Secondary | ICD-10-CM | POA: Diagnosis present

## 2018-08-19 DIAGNOSIS — M79605 Pain in left leg: Secondary | ICD-10-CM | POA: Insufficient documentation

## 2018-08-19 DIAGNOSIS — M48061 Spinal stenosis, lumbar region without neurogenic claudication: Secondary | ICD-10-CM

## 2018-08-19 DIAGNOSIS — M5136 Other intervertebral disc degeneration, lumbar region: Secondary | ICD-10-CM | POA: Diagnosis present

## 2018-08-19 DIAGNOSIS — M5416 Radiculopathy, lumbar region: Secondary | ICD-10-CM | POA: Diagnosis present

## 2018-08-19 DIAGNOSIS — M4310 Spondylolisthesis, site unspecified: Secondary | ICD-10-CM | POA: Insufficient documentation

## 2018-08-19 MED ORDER — MELATONIN 10 MG PO CAPS: 20.0000 mg | ORAL_CAPSULE | Freq: Every evening | ORAL | 5 refills | Status: DC | PRN

## 2018-08-19 MED ORDER — GABAPENTIN 300 MG PO CAPS: ORAL_CAPSULE | ORAL | 5 refills | Status: DC

## 2018-08-19 NOTE — Patient Instructions (Signed)
____________________________________________________________________________________________  Preparing for Procedure with Sedation  Instructions: . Oral Intake: Do not eat or drink anything for at least 8 hours prior to your procedure. . Transportation: Public transportation is not allowed. Bring an adult driver. The driver must be physically present in our waiting room before any procedure can be started. . Physical Assistance: Bring an adult physically capable of assisting you, in the event you need help. This adult should keep you company at home for at least 6 hours after the procedure. . Blood Pressure Medicine: Take your blood pressure medicine with a sip of water the morning of the procedure. . Blood thinners: Notify our staff if you are taking any blood thinners. Depending on which one you take, there will be specific instructions on how and when to stop it. . Diabetics on insulin: Notify the staff so that you can be scheduled 1st case in the morning. If your diabetes requires high dose insulin, take only  of your normal insulin dose the morning of the procedure and notify the staff that you have done so. . Preventing infections: Shower with an antibacterial soap the morning of your procedure. . Build-up your immune system: Take 1000 mg of Vitamin C with every meal (3 times a day) the day prior to your procedure. . Antibiotics: Inform the staff if you have a condition or reason that requires you to take antibiotics before dental procedures. . Pregnancy: If you are pregnant, call and cancel the procedure. . Sickness: If you have a cold, fever, or any active infections, call and cancel the procedure. . Arrival: You must be in the facility at least 30 minutes prior to your scheduled procedure. . Children: Do not bring children with you. . Dress appropriately: Bring dark clothing that you would not mind if they get stained. . Valuables: Do not bring any jewelry or valuables.  Procedure  appointments are reserved for interventional treatments only. . No Prescription Refills. . No medication changes will be discussed during procedure appointments. . No disability issues will be discussed.  Reasons to call and reschedule or cancel your procedure: (Following these recommendations will minimize the risk of a serious complication.) . Surgeries: Avoid having procedures within 2 weeks of any surgery. (Avoid for 2 weeks before or after any surgery). . Flu Shots: Avoid having procedures within 2 weeks of a flu shots or . (Avoid for 2 weeks before or after immunizations). . Barium: Avoid having a procedure within 7-10 days after having had a radiological study involving the use of radiological contrast. (Myelograms, Barium swallow or enema study). . Heart attacks: Avoid any elective procedures or surgeries for the initial 6 months after a "Myocardial Infarction" (Heart Attack). . Blood thinners: It is imperative that you stop these medications before procedures. Let us know if you if you take any blood thinner.  . Infection: Avoid procedures during or within two weeks of an infection (including chest colds or gastrointestinal problems). Symptoms associated with infections include: Localized redness, fever, chills, night sweats or profuse sweating, burning sensation when voiding, cough, congestion, stuffiness, runny nose, sore throat, diarrhea, nausea, vomiting, cold or Flu symptoms, recent or current infections. It is specially important if the infection is over the area that we intend to treat. . Heart and lung problems: Symptoms that may suggest an active cardiopulmonary problem include: cough, chest pain, breathing difficulties or shortness of breath, dizziness, ankle swelling, uncontrolled high or unusually low blood pressure, and/or palpitations. If you are experiencing any of these symptoms, cancel   your procedure and contact your primary care physician for an evaluation.  Remember:   Regular Business hours are:  Monday to Thursday 8:00 AM to 4:00 PM  Provider's Schedule: Callie Facey, MD:  Procedure days: Tuesday and Thursday 7:30 AM to 4:00 PM  Bilal Lateef, MD:  Procedure days: Monday and Wednesday 7:30 AM to 4:00 PM ____________________________________________________________________________________________    

## 2018-11-26 ENCOUNTER — Encounter: Admission: RE | Payer: Self-pay | Source: Home / Self Care

## 2018-11-26 ENCOUNTER — Ambulatory Visit: Admission: RE | Admit: 2018-11-26 | Payer: Medicare Other | Source: Home / Self Care | Admitting: Gastroenterology

## 2018-11-26 SURGERY — EGD (ESOPHAGOGASTRODUODENOSCOPY)
Anesthesia: General

## 2019-02-17 ENCOUNTER — Encounter: Payer: Medicare Other | Admitting: Pain Medicine

## 2019-02-18 ENCOUNTER — Encounter: Payer: Self-pay | Admitting: Pain Medicine

## 2019-02-18 ENCOUNTER — Telehealth: Payer: Self-pay

## 2019-02-18 NOTE — Progress Notes (Signed)
Pain Management Virtual Encounter Note - Virtual Visit via Telephone Telehealth (real-time audio visits between healthcare provider and patient).   Patient's Phone No. & Preferred Pharmacy:  [phone] (home); [phone] (mobile); (Preferred) [phone] No e-mail address on record  [place], [place] - [address] [place] [address] Phone: [phone] Fax: [phone]    Pre-screening note:  Our staff contacted Andrea Yang and offered her an "in person", "face-to-face" appointment versus a telephone encounter. She indicated preferring the telephone encounter, at this time.   Reason for Virtual Visit: COVID-19*  Social distancing based on CDC and AMA recommendations.   I contacted Andrea Yang on 02/19/2019 via telephone.      I clearly identified myself as Gaspar Cola, MD. I verified that I was speaking with the correct person using two identifiers (Name: Andrea Yang, and date of birth: [birth year]).  Advanced Informed Consent I sought verbal advanced consent from Andrea Yang for virtual visit interactions. I informed Andrea Yang of possible security and privacy concerns, risks, and limitations associated with providing "not-in-person" medical evaluation and management services. I also informed Andrea Yang of the availability of "in-person" appointments. Finally, I informed her that there would be a charge for the virtual visit and that she could be  personally, fully or partially, financially responsible for it. Ms. Yang expressed understanding and agreed to proceed.   Historic Elements   Andrea Yang is a 68 y.o. year old, female patient evaluated today after her last encounter by our practice on 02/18/2019. Ms. Andrea  has a past medical history of Chronic constipation, Fatty liver disease, nonalcoholic, Fibromyalgia, GERD (gastroesophageal reflux disease), History of hiatal hernia, and Hypertension. She also  has a past  surgical history that includes Colonoscopy with propofol (N/A, 09/27/2015). Andrea Yang has a current medication list which includes the following prescription(s): albuterol, amlodipine, calcium carbonate, cetirizine, cholecalciferol, diphenhydramine, evening primrose oil, famotidine, fluconazole, fluticasone, magnesium oxide, metoprolol tartrate, multivitamin, polyethylene glycol, sucralfate, gabapentin, and melatonin. She  reports that she has never smoked. She has never used smokeless tobacco. She reports that she does not drink alcohol or use drugs. Andrea Yang is allergic to dicyclomine; minoxidil; triamcinolone acetonide; valacyclovir; amantadines; cymbalta [duloxetine hcl]; erythromycin; hydrochlorothiazide; iodine; meloxicam; nexium [esomeprazole magnesium]; prilosec [omeprazole]; savella [milnacipran hcl]; cleocin [clindamycin hcl]; fluocinolone acetonide; latex; and tramadol.   HPI  Today, she is being contacted for medication management.  The patient indicates not having any side effects or problems associated with her medication.  However, she has been having a lot more neck pain and occipital headaches.  She has had this evaluated with ultrasounds of the cervical area looking for possible vertebral artery occlusions.  All of the testing to have been negative.  However, she still having the problems.  A review of the patient's cervical MRI done in [place] on 05/15/2018 showed that the patient has multilevel cervical facet hypertrophy.  The patient also has clinical evidence of bilateral cervical facet syndrome with the right side being worse than the left.  I have talked to the patient about this and the possibility of doing a diagnostic bilateral cervical facet block under fluoroscopic guidance and IV sedation.  If this proved to be a positive test, then the patient may be a candidate for radiofrequency ablation of the cervical facets.  Pharmacotherapy Assessment  Analgesic:  Not using opioid  analgesics.    Monitoring: Pharmacotherapy: No side-effects or adverse reactions reported. [place] PMP: PDMP reviewed during this encounter.  Compliance: No problems identified. Effectiveness: Clinically acceptable. Plan: Refer to "POC".  Pertinent Labs   SAFETY SCREENING Profile No results found for: SARSCOV2NAA, COVIDSOURCE, STAPHAUREUS, MRSAPCR, HCVAB, HIV, PREGTESTUR Renal Function Lab Results  Component Value Date   BUN 16 12/11/2017   CREATININE 1.04 (H) 12/11/2017   BCR 15 12/11/2017   GFRAA 65 12/11/2017   GFRNONAA 56 (L) 12/11/2017   Hepatic Function Lab Results  Component Value Date   AST 22 12/11/2017   ALBUMIN 4.2 12/11/2017   UDS Summary  Date Value Ref Range Status  12/10/2017 FINAL  Final    Comment:    ==================================================================== TOXASSURE COMP DRUG ANALYSIS,UR ==================================================================== Test                             Result       Flag       Units Drug Present and Declared for Prescription Verification   Gabapentin                     PRESENT      EXPECTED Drug Present not Declared for Prescription Verification   Metoprolol                     PRESENT      UNEXPECTED Drug Absent but Declared for Prescription Verification   Cyclobenzaprine                Not Detected UNEXPECTED   Diphenhydramine                Not Detected UNEXPECTED ==================================================================== Test                      Result    Flag   Units      Ref Range   Creatinine              118              mg/dL      >=20 ==================================================================== Declared Medications:  The flagging and interpretation on this report are based on the  following declared medications.  Unexpected results may arise from  inaccuracies in the declared medications.  **Note: The testing scope of this panel includes these medications:   Cyclobenzaprine  Diphenhydramine  Gabapentin  **Note: The testing scope of this panel does not include following  reported medications:  Albuterol  Amlodipine  Calcium carbonate  Cetirizine  Cholecalciferol  Fluconazole  Fluticasone  Multivitamin  Polyethylene Glycol  Sucralfate  Supplement (Primrose) ==================================================================== For clinical consultation, please call [phone]. ====================================================================    Note: Above Lab results reviewed.  Recent imaging  DG C-Arm 1-60 Min-No Report Fluoroscopy was utilized by the requesting physician.  No radiographic  interpretation.   Assessment  The primary encounter diagnosis was Chronic pain syndrome. Diagnoses of Chronic low back pain (Primary Area of Pain) (Bilateral) (R>L), Chronic lower extremity pain (Secondary Area of Pain) (Bilateral) (R>L), Chronic neck pain (Tertiary Area of Pain) (Bilateral) (R>L), Cervical facet syndrome (Bilateral) (R>L), Cervicalgia, Chronic upper extremity pain (Fourth Area of Pain) (Bilateral) (R>L), Insomnia secondary to chronic pain, Neurogenic pain, Pharmacologic therapy, Disorder of skeletal system, and Problems influencing health status were also pertinent to this visit.  Plan of Care  I am having Andrea Yang maintain her albuterol, EVENING PRIMROSE OIL PO, calcium carbonate, cetirizine, cholecalciferol, diphenhydrAMINE, fluconazole, fluticasone, multivitamin, sucralfate, amLODipine, polyethylene glycol, metoprolol tartrate, famotidine, Magnesium Oxide, Melatonin,  and gabapentin.  Pharmacotherapy (Medications Ordered): Meds ordered this encounter  Medications  . Melatonin 10 MG CAPS    Sig: Take 20 mg by mouth at bedtime as needed.    Dispense:  30 capsule    Refill:  5    Fill one day early if pharmacy is closed on scheduled refill date. May substitute for generic if available.  . gabapentin (NEURONTIN) 300 MG  capsule    Sig: Take 1 cap (300 mg) in AM & Noon, and 2 caps (600 mg) HS    Dispense:  120 capsule    Refill:  5    Fill one day early if pharmacy is closed on scheduled refill date. May substitute for generic if available.   Orders:  Orders Placed This Encounter  Procedures  . CERVICAL FACET (MEDIAL BRANCH NERVE BLOCK)     Standing Status:   Future    Standing Expiration Date:   03/22/2019    Scheduling Instructions:     Side: Bilateral     Level: C3-4, C4-5, C5-6 Facet joints (C3, C4, C5, C6, & C7 Medial Branch Nerves)     Sedation: With Sedation.     Timeframe: As soon as schedule allows    Order Specific Question:   Where will this procedure be performed?    Answer:   [facility]  . Comp. Metabolic Panel (12)    With GFR. Indications: Chronic Pain Syndrome (G89.4) & Pharmacotherapy (K74.259)    Order Specific Question:   Has the patient fasted?    Answer:   No    Order Specific Question:   CC Results    Answer:   PCP-NURSE [[id number]]  . Magnesium    Indication: Pharmacologic therapy (I43.329)    Order Specific Question:   CC Results    Answer:   PCP-NURSE [[id number]]  . Vitamin B12    Indication: Pharmacologic therapy (Y60.630).    Order Specific Question:   CC Results    Answer:   PCP-NURSE [[id number]]  . Sedimentation rate    Indication: Disorder of skeletal system (M89.9)    Order Specific Question:   CC Results    Answer:   PCP-NURSE [[id number]]  . 25-Hydroxyvitamin D Lcms D2+D3    Indication: Disorder of skeletal system (M89.9).    Order Specific Question:   CC Results    Answer:   PCP-NURSE [[id number]]  . C-reactive protein    Indication: Problems influencing health status (Z78.9)    Order Specific Question:   CC Results    Answer:   PCP-NURSE [[id number]]   Follow-up plan:   Return in about 23 weeks (around 07/30/2019) for (VV), E/M (MM), in addition, Procedure (w/ sedation): (B) C-FCT Blk #1.     Considering:   NOTE: KENALOG ALLERGY (Use Decadron) Diagnostic  bilateral lumbar facet block #2 Possible bilateral lumbar facet RFA Diagnostic right-sided sacroiliac joint block Possible right-sided sacroiliac joint RFA DiagnosticrightCESI Diagnostic bilateral cervical facet nerve block  Possible bilateral cervical facet RFA Diagnostic bilateral L4-5 transforaminal LESI Therapeutic left-sided L4-5 LESI #3  Diagnostic right-sided L3-4 LESI Diagnostic trigger point injections Diagnosticlidocaine infusion   Palliative PRN treatment(s):   Palliative/therapeutic left L4-5 LESI #3under fluoro and IV sedation. (using Decadron)    Recent Visits No visits were found meeting these conditions.  Showing recent visits within past 90 days and meeting all other requirements   Today's Visits Date Type Provider Dept  02/19/19 Office Visit Milinda Pointer, MD [facility]  Showing today's visits and  meeting all other requirements   Future Appointments No visits were found meeting these conditions.  Showing future appointments within next 90 days and meeting all other requirements   I discussed the assessment and treatment plan with the patient. The patient was provided an opportunity to ask questions and all were answered. The patient agreed with the plan and demonstrated an understanding of the instructions.  Patient advised to call back or seek an in-person evaluation if the symptoms or condition worsens.  Total duration of non-face-to-face encounter: 15 minutes.  Note by: Gaspar Cola, MD Date: 02/19/2019; Time: 9:17 AM  Note: This dictation was prepared with Dragon dictation. Any transcriptional errors that may result from this process are unintentional.  Disclaimer:  * Given the special circumstances of the COVID-19 pandemic, the federal government has announced that the Office for Civil Rights (OCR) will exercise its enforcement discretion and will not impose penalties on physicians using telehealth in the event of  noncompliance with regulatory requirements under the [place] (HIPAA) in connection with the good faith provision of telehealth during the TFTDD-22 national public health emergency. ([place])

## 2019-02-19 ENCOUNTER — Ambulatory Visit: Payer: Medicare Other | Attending: Nurse Practitioner | Admitting: Pain Medicine

## 2019-02-19 ENCOUNTER — Other Ambulatory Visit: Payer: Self-pay

## 2019-02-19 DIAGNOSIS — G894 Chronic pain syndrome: Secondary | ICD-10-CM | POA: Diagnosis not present

## 2019-02-19 DIAGNOSIS — M545 Low back pain, unspecified: Secondary | ICD-10-CM

## 2019-02-19 DIAGNOSIS — M542 Cervicalgia: Secondary | ICD-10-CM | POA: Diagnosis not present

## 2019-02-19 DIAGNOSIS — G4701 Insomnia due to medical condition: Secondary | ICD-10-CM

## 2019-02-19 DIAGNOSIS — M792 Neuralgia and neuritis, unspecified: Secondary | ICD-10-CM

## 2019-02-19 DIAGNOSIS — Z789 Other specified health status: Secondary | ICD-10-CM

## 2019-02-19 DIAGNOSIS — M47812 Spondylosis without myelopathy or radiculopathy, cervical region: Secondary | ICD-10-CM

## 2019-02-19 DIAGNOSIS — M79605 Pain in left leg: Secondary | ICD-10-CM

## 2019-02-19 DIAGNOSIS — G8929 Other chronic pain: Secondary | ICD-10-CM

## 2019-02-19 DIAGNOSIS — Z79899 Other long term (current) drug therapy: Secondary | ICD-10-CM

## 2019-02-19 DIAGNOSIS — M899 Disorder of bone, unspecified: Secondary | ICD-10-CM

## 2019-02-19 DIAGNOSIS — M79601 Pain in right arm: Secondary | ICD-10-CM

## 2019-02-19 DIAGNOSIS — M79602 Pain in left arm: Secondary | ICD-10-CM

## 2019-02-19 DIAGNOSIS — M79604 Pain in right leg: Secondary | ICD-10-CM | POA: Diagnosis not present

## 2019-02-19 MED ORDER — GABAPENTIN 300 MG PO CAPS: ORAL_CAPSULE | ORAL | 5 refills | Status: DC

## 2019-02-19 MED ORDER — MELATONIN 10 MG PO CAPS: 20.0000 mg | ORAL_CAPSULE | Freq: Every evening | ORAL | 5 refills | Status: DC | PRN

## 2019-02-19 NOTE — Patient Instructions (Signed)

## 2019-02-24 ENCOUNTER — Other Ambulatory Visit
Admission: RE | Admit: 2019-02-24 | Discharge: 2019-02-24 | Disposition: A | Discharge status: Home / Self Care | Payer: Medicare Other | Source: Ambulatory Visit | Attending: Pain Medicine | Admitting: Pain Medicine

## 2019-02-24 ENCOUNTER — Other Ambulatory Visit: Payer: Self-pay

## 2019-02-24 DIAGNOSIS — Z1159 Encounter for screening for other viral diseases: Secondary | ICD-10-CM | POA: Insufficient documentation

## 2019-02-25 LAB — SARS CORONAVIRUS 2 (TAT 6-24 HRS): SARS Coronavirus 2: NEGATIVE

## 2019-02-26 DIAGNOSIS — M47812 Spondylosis without myelopathy or radiculopathy, cervical region: Secondary | ICD-10-CM | POA: Insufficient documentation

## 2019-02-26 NOTE — Patient Instructions (Signed)

## 2019-02-26 NOTE — Progress Notes (Signed)
Patient's Name: Andrea Yang  MRN: [id number]  Referring Provider: Renee Rival, NP  DOB: August 07, 1951  PCP: Renee Rival, NP  DOS: 02/27/2019  Note by: Gaspar Cola, MD  Service setting: Ambulatory outpatient  Specialty: Interventional Pain Management  Patient type: Established  Location: [facility] (AMB) Pain Management Facility  Visit type: Interventional Procedure   Primary Reason for Visit: Interventional Pain Management Treatment. CC: Neck Pain  Procedure:          Anesthesia, Analgesia, Anxiolysis:  Type: Cervical Facet Medial Branch Block(s)  #1  Primary Purpose: Diagnostic Region: Posterolateral cervical spine Level: C3, C4, C5, C6, & C7 Medial Branch Level(s). Injecting these levels blocks the C3-4, C4-5, C5-6, and C6-7 cervical facet joints. Laterality: Bilateral  Type: Moderate (Conscious) Sedation combined with Local Anesthesia Indication(s): Analgesia and Anxiety Route: Intravenous (IV) IV Access: Secured Sedation: Meaningful verbal contact was maintained at all times during the procedure  Local Anesthetic: Lidocaine 1-2%  Position: Prone with head of the table raised to facilitate breathing.   Indications: 1. Cervical facet syndrome (Bilateral) (R>L)   2. Cervicalgia   3. Spondylosis without myelopathy or radiculopathy, cervical region   4. DDD (degenerative disc disease), cervical   5. Cervical facet hypertrophy (Bilateral)   6. Cervical facet arthropathy (Multilevel) (Bilateral)   7. History of allergy to contrast   8. History of Iodine Allergy (Precautions)   9. Latex precautions, history of latex allergy    Pain Score: Pre-procedure: 4 /10 Post-procedure: 0-No pain/10 I interviewed the patient before leaving and she indicated not having any neck pain, shoulder pain, or the headache that she came with this morning.  She also indicated not having any pain between the shoulder blades, but she said that she was also not experiencing pain in that area before  the procedure either.  Based on the initial results, I would seem that the facet joints were responsible for her cervicalgia, bilateral upper shoulder pain, and the cervicogenic headaches.  Pertinent Labs  COVID-19 screennig: Lab Results  Component Value Date   [place] NEGATIVE 02/24/2019   Pre-op Assessment:  Ms. Kavanaugh is a 68 y.o. (year old), female patient, seen today for interventional treatment. She  has a past surgical history that includes Colonoscopy with propofol (N/A, 09/27/2015). Ms. Maret has a current medication list which includes the following prescription(s): albuterol, amlodipine, calcium carbonate, cetirizine, cholecalciferol, diphenhydramine, evening primrose oil, famotidine, fluconazole, fluticasone, gabapentin, magnesium oxide, melatonin, metoprolol tartrate, multivitamin, polyethylene glycol, and sucralfate, and the following Facility-Administered Medications: fentanyl and midazolam. Her primarily concern today is the Neck Pain  Initial Vital Signs:  Pulse/HCG Rate: (!) 44ECG Heart Rate: (!) 47 (asymptomatic bradycardia) the patient did not have any symptoms associated with this and she indicated that she has been taking some medication that brings her heart rate down. Temp: 98.4 F (36.9 C) Resp: 16 BP: (!) 157/62 SpO2: 100 %  BMI: Estimated body mass index is 32.22 kg/m as calculated from the following:   Height as of this encounter: 5' (1.524 m).   Weight as of this encounter: 165 lb (74.8 kg).  Risk Assessment: Allergies: Reviewed. She is allergic to dicyclomine; minoxidil; triamcinolone acetonide; valacyclovir; amantadines; cymbalta [duloxetine hcl]; erythromycin; hydrochlorothiazide; iodine; meloxicam; nexium [esomeprazole magnesium]; prilosec [omeprazole]; savella [milnacipran hcl]; cleocin [clindamycin hcl]; fluocinolone acetonide; latex; and tramadol.  Allergy Precautions: None required Coagulopathies: Reviewed. None identified.  Blood-thinner therapy:  None at this time Active Infection(s): Reviewed. None identified. Ms. Glock is afebrile  Site Confirmation: Ms. Lansing  was asked to confirm the procedure and laterality before marking the site Procedure checklist: Completed Consent: Before the procedure and under the influence of no sedative(s), amnesic(s), or anxiolytics, the patient was informed of the treatment options, risks and possible complications. To fulfill our ethical and legal obligations, as recommended by the American Medical Association's Code of Ethics, I have informed the patient of my clinical impression; the nature and purpose of the treatment or procedure; the risks, benefits, and possible complications of the intervention; the alternatives, including doing nothing; the risk(s) and benefit(s) of the alternative treatment(s) or procedure(s); and the risk(s) and benefit(s) of doing nothing. The patient was provided information about the general risks and possible complications associated with the procedure. These may include, but are not limited to: failure to achieve desired goals, infection, bleeding, organ or nerve damage, allergic reactions, paralysis, and death. In addition, the patient was informed of those risks and complications associated to Spine-related procedures, such as failure to decrease pain; infection (i.e.: Meningitis, epidural or intraspinal abscess); bleeding (i.e.: epidural hematoma, subarachnoid hemorrhage, or any other type of intraspinal or peri-dural bleeding); organ or nerve damage (i.e.: Any type of peripheral nerve, nerve root, or spinal cord injury) with subsequent damage to sensory, motor, and/or autonomic systems, resulting in permanent pain, numbness, and/or weakness of one or several areas of the body; allergic reactions; (i.e.: anaphylactic reaction); and/or death. Furthermore, the patient was informed of those risks and complications associated with the medications. These include, but are not limited to:  allergic reactions (i.e.: anaphylactic or anaphylactoid reaction(s)); adrenal axis suppression; blood sugar elevation that in diabetics may result in ketoacidosis or comma; water retention that in patients with history of congestive heart failure may result in shortness of breath, pulmonary edema, and decompensation with resultant heart failure; weight gain; swelling or edema; medication-induced neural toxicity; particulate matter embolism and blood vessel occlusion with resultant organ, and/or nervous system infarction; and/or aseptic necrosis of one or more joints. Finally, the patient was informed that Medicine is not an exact science; therefore, there is also the possibility of unforeseen or unpredictable risks and/or possible complications that may result in a catastrophic outcome. The patient indicated having understood very clearly. We have given the patient no guarantees and we have made no promises. Enough time was given to the patient to ask questions, all of which were answered to the patient's satisfaction. Ms. Mish has indicated that she wanted to continue with the procedure. Attestation: I, the ordering provider, attest that I have discussed with the patient the benefits, risks, side-effects, alternatives, likelihood of achieving goals, and potential problems during recovery for the procedure that I have provided informed consent. Date   Time: 02/27/2019  8:18 AM  Pre-Procedure Preparation:  Monitoring: As per clinic protocol. Respiration, ETCO2, SpO2, BP, heart rate and rhythm monitor placed and checked for adequate function Safety Precautions: Patient was assessed for positional comfort and pressure points before starting the procedure. Time-out: I initiated and conducted the "Time-out" before starting the procedure, as per protocol. The patient was asked to participate by confirming the accuracy of the "Time Out" information. Verification of the correct person, site, and procedure were  performed and confirmed by me, the nursing staff, and the patient. "Time-out" conducted as per Joint Commission's Universal Protocol (UP.01.01.01). Time: 0848  Description of Procedure:          Laterality: Bilateral. The procedure was performed in identical fashion on both sides. Level: C3, C4, C5, C6, & C7 Medial Branch Level(s).  Area Prepped: Posterior Cervico-thoracic Region Prepping solution: DuraPrep (Iodine Povacrylex [0.7% available iodine] and Isopropyl Alcohol, 74% w/w) Safety Precautions: Aspiration looking for blood return was conducted prior to all injections. At no point did we inject any substances, as a needle was being advanced. Before injecting, the patient was told to immediately notify me if she was experiencing any new onset of "ringing in the ears, or metallic taste in the mouth". No attempts were made at seeking any paresthesias. Safe injection practices and needle disposal techniques used. Medications properly checked for expiration dates. SDV (single dose vial) medications used. After the completion of the procedure, all disposable equipment used was discarded in the proper designated medical waste containers. Local Anesthesia: Protocol guidelines were followed. The patient was positioned over the fluoroscopy table. The area was prepped in the usual manner. The time-out was completed. The target area was identified using fluoroscopy. A 12-in long, straight, sterile hemostat was used with fluoroscopic guidance to locate the targets for each level blocked. Once located, the skin was marked with an approved surgical skin marker. Once all sites were marked, the skin (epidermis, dermis, and hypodermis), as well as deeper tissues (fat, connective tissue and muscle) were infiltrated with a small amount of a short-acting local anesthetic, loaded on a 10cc syringe with a 25G, 1.5-in  Needle. An appropriate amount of time was allowed for local anesthetics to take effect before proceeding to  the next step. Local Anesthetic: Lidocaine 2.0% The unused portion of the local anesthetic was discarded in the proper designated containers. Technical explanation of process:  C3 Medial Branch Nerve Block (MBB): The target area for the C3 dorsal medial articular branch is the lateral concave waist of the articular pillar of C3. Under fluoroscopic guidance, a Quincke needle was inserted until contact was made with os over the postero-lateral aspect of the articular pillar of C3 (target area). After negative aspiration for blood, 0.5 mL of the nerve block solution was injected without difficulty or complication. The needle was removed intact. C4 Medial Branch Nerve Block (MBB): The target area for the C4 dorsal medial articular branch is the lateral concave waist of the articular pillar of C4. Under fluoroscopic guidance, a Quincke needle was inserted until contact was made with os over the postero-lateral aspect of the articular pillar of C4 (target area). After negative aspiration for blood, 0.5 mL of the nerve block solution was injected without difficulty or complication. The needle was removed intact. C5 Medial Branch Nerve Block (MBB): The target area for the C5 dorsal medial articular branch is the lateral concave waist of the articular pillar of C5. Under fluoroscopic guidance, a Quincke needle was inserted until contact was made with os over the postero-lateral aspect of the articular pillar of C5 (target area). After negative aspiration for blood, 0.5 mL of the nerve block solution was injected without difficulty or complication. The needle was removed intact. C6 Medial Branch Nerve Block (MBB): The target area for the C6 dorsal medial articular branch is the lateral concave waist of the articular pillar of C6. Under fluoroscopic guidance, a Quincke needle was inserted until contact was made with os over the postero-lateral aspect of the articular pillar of C6 (target area). After negative aspiration  for blood, 0.5 mL of the nerve block solution was injected without difficulty or complication. The needle was removed intact. C7 Medial Branch Nerve Block (MBB): The target for the C7 dorsal medial articular branch lies on the superior-medial tip of the C7 transverse process.  Under fluoroscopic guidance, a Quincke needle was inserted until contact was made with os over the postero-lateral aspect of the articular pillar of C7 (target area). After negative aspiration for blood, 0.5 mL of the nerve block solution was injected without difficulty or complication. The needle was removed intact. Procedural Needles: 22-gauge, 3.5-inch, Quincke needles used for all levels. Nerve block solution: 0.2% PF-Ropivacaine + Triamcinolone (40 mg/mL) diluted to a final concentration of 4 mg of Triamcinolone/mL of Ropivacaine The unused portion of the solution was discarded in the proper designated containers.  Once the entire procedure was completed, the treated area was cleaned, making sure to leave some of the prepping solution back to take advantage of its long term bactericidal properties.  Anatomy Reference Guide:       Vitals:   02/27/19 0926 02/27/19 0936 02/27/19 0947 02/27/19 0957  BP: (!) 95/49 114/64 137/65 114/68  Pulse:      Resp: 16 10 16 13   Temp:      TempSrc:      SpO2: 100% 100% 99% 100%  Weight:      Height:        Start Time: 0848 hrs. End Time: 0905 hrs.  Imaging Guidance (Spinal):          Type of Imaging Technique: Fluoroscopy Guidance (Spinal) Indication(s): Assistance in needle guidance and placement for procedures requiring needle placement in or near specific anatomical locations not easily accessible without such assistance. Exposure Time: Please see nurses notes. Contrast: None used. Fluoroscopic Guidance: I was personally present during the use of fluoroscopy. "Tunnel Vision Technique" used to obtain the best possible view of the target area. Parallax error corrected before  commencing the procedure. "Direction-depth-direction" technique used to introduce the needle under continuous pulsed fluoroscopy. Once target was reached, antero-posterior, oblique, and lateral fluoroscopic projection used confirm needle placement in all planes. Images permanently stored in EMR. Interpretation: No contrast injected. I personally interpreted the imaging intraoperatively. Adequate needle placement confirmed in multiple planes. Permanent images saved into the patient's record.  Antibiotic Prophylaxis:   Anti-infectives (From admission, onward)   None     Indication(s): None identified  Post-operative Assessment:  Post-procedure Vital Signs:  Pulse/HCG Rate: (!) 46(!) 47 Temp: 98.4 F (36.9 C) Resp: 13 BP: 114/68 SpO2: 100 %  EBL: None  Complications: No immediate post-treatment complications observed by team, or reported by patient.  Note: The patient tolerated the entire procedure well. A repeat set of vitals were taken after the procedure and the patient was kept under observation following institutional policy, for this type of procedure. Post-procedural neurological assessment was performed, showing return to baseline, prior to discharge. The patient was provided with post-procedure discharge instructions, including a section on how to identify potential problems. Should any problems arise concerning this procedure, the patient was given instructions to immediately contact us, at any time, without hesitation. In any case, we plan to contact the patient by telephone for a follow-up status report regarding this interventional procedure.  Comments:  The patient does seem to be very sensitive to the sedation and she also has a history of obstructive sleep apnea.  For a short period time after the procedure I had to apply some jaw thrust to keep her airway open.  This dissipated by itself without any problems.  Plan of Care  Orders:  Orders Placed This Encounter    Procedures   CERVICAL FACET (MEDIAL BRANCH NERVE BLOCK)     Scheduling Instructions:     Side: Bilateral  Level: C3-4, C4-5, C5-6 Facet joints (C3, C4, C5, C6, & C7 Medial Branch Nerves)     Sedation: With Sedation.     Timeframe: Today    Order Specific Question:   Where will this procedure be performed?    Answer:   [facility]   DG PA[facility] C-ARM 1-60 MIN NO REPORT    Intraoperative interpretation by procedural physician at [place].    Standing Status:   Standing    Number of Occurrences:   1    Order Specific Question:   Reason for exam:    Answer:   Assistance in needle guidance and placement for procedures requiring needle placement in or near specific anatomical locations not easily accessible without such assistance.   Provider attestation of informed consent for procedure/surgical case    I, the ordering provider, attest that I have discussed with the patient the benefits, risks, side effects, alternatives, likelihood of achieving goals and potential problems during recovery for the procedure that I have provided informed consent.    Standing Status:   Standing    Number of Occurrences:   1   Informed Consent Details: Transcribe to consent form and obtain patient signature    Surgeon: Nishat Livingston A. Dossie Arbour, MD    Scheduling Instructions:     Procedure: Bilateral Cervical facet block under fluoroscopic guidance.     Indications: Chronic neck pain secondary to cervical facet syndrome   Miscellanous precautions    Standing Status:   Standing    Number of Occurrences:   1   Miscellanous precautions    NOTE: Although It is true that patients can have allergies to shellfish and that shellfish contain iodine, most shellfish  allergies are due to two protein allergens present in the shellfish: tropomyosins and parvalbumin. Not all patients with shellfish allergies are allergic to iodine. However, as a precaution, avoid using iodine containing  products.    Standing Status:   Standing    Number of Occurrences:   1   Latex precautions    Activate Latex-Free Protocol.    Standing Status:   Standing    Number of Occurrences:   1   Chronic Opioid Analgesic:  Not using opioid analgesics.    Medications ordered for procedure: Meds ordered this encounter  Medications   lidocaine (XYLOCAINE) 2 % (with pres) injection 400 mg   lactated ringers infusion 1,000 mL   midazolam (VERSED) 5 MG/5ML injection 1-2 mg    Make sure Flumazenil is available in the pyxis when using this medication. If oversedation occurs, administer 0.2 mg IV over 15 sec. If after 45 sec no response, administer 0.2 mg again over 1 min; may repeat at 1 min intervals; not to exceed 4 doses (1 mg)   fentaNYL (SUBLIMAZE) injection 25-50 mcg    Make sure Narcan is available in the pyxis when using this medication. In the event of respiratory depression (RR< 8/min): Titrate NARCAN (naloxone) in increments of 0.1 to 0.2 mg IV at 2-3 minute intervals, until desired degree of reversal.   ropivacaine (PF) 2 mg/mL (0.2%) (NAROPIN) injection 18 mL   dexamethasone (DECADRON) injection 20 mg   Medications administered: We administered lidocaine, lactated ringers, midazolam, fentaNYL, ropivacaine (PF) 2 mg/mL (0.2%), and dexamethasone.  See the medical record for exact dosing, route, and time of administration.  Follow-up plan:   Return for (VV), 2 wk PP-F/U Eval.       Considering:   NOTE: KENALOG (Use Decadron), IODINE, CONTRAST,  and LATEX Allergy. Diagnostic bilateral lumbar facet block #2 Possible bilateral lumbar facet RFA Diagnostic right-sided sacroiliac joint block Possible right-sided sacroiliac joint RFA DiagnosticrightCESI Diagnostic bilateral cervical facet nerve block  Possible bilateral cervical facet RFA Diagnostic bilateral L4-5 transforaminal LESI Therapeutic left-sided L4-5 LESI #3  Diagnostic right-sided L3-4 LESI Diagnostic trigger  point injections Diagnosticlidocaine infusion   Palliative PRN treatment(s):   Palliative/therapeutic left L4-5 LESI #3under fluoro and IV sedation. (using Decadron)     Recent Visits Date Type Provider Dept  02/19/19 Office Visit Milinda Pointer, MD [facility]  Showing recent visits within past 90 days and meeting all other requirements   Today's Visits Date Type Provider Dept  02/27/19 Procedure visit Milinda Pointer, MD [facility]  Showing today's visits and meeting all other requirements   Future Appointments Date Type Provider Dept  03/17/19 Appointment Milinda Pointer, MD [facility]  Showing future appointments within next 90 days and meeting all other requirements   Disposition: Discharge home  Discharge Date & Time: 02/27/2019; 1010 hrs.   Primary Care Physician: Renee Rival, NP Location: [facility] Note by: Gaspar Cola, MD Date: 02/27/2019; Time: 10:38 AM  Disclaimer:  Medicine is not an exact science. The only guarantee in medicine is that nothing is guaranteed. It is important to note that the decision to proceed with this intervention was based on the information collected from the patient. The Data and conclusions were drawn from the patient's questionnaire, the interview, and the physical examination. Because the information was provided in large part by the patient, it cannot be guaranteed that it has not been purposely or unconsciously manipulated. Every effort has been made to obtain as much relevant data as possible for this evaluation. It is important to note that the conclusions that lead to this procedure are derived in large part from the available data. Always take into account that the treatment will also be dependent on availability of resources and existing treatment guidelines, considered by other Pain Management Practitioners as being common knowledge and practice, at the  time of the intervention. For Medico-Legal purposes, it is also important to point out that variation in procedural techniques and pharmacological choices are the acceptable norm. The indications, contraindications, technique, and results of the above procedure should only be interpreted and judged by a Board-Certified Interventional Pain Specialist with extensive familiarity and expertise in the same exact procedure and technique.

## 2019-02-27 ENCOUNTER — Other Ambulatory Visit: Payer: Self-pay

## 2019-02-27 ENCOUNTER — Encounter: Payer: Self-pay | Admitting: Pain Medicine

## 2019-02-27 ENCOUNTER — Ambulatory Visit
Admission: RE | Admit: 2019-02-27 | Discharge: 2019-02-27 | Disposition: A | Discharge status: Home / Self Care | Payer: Medicare Other | Source: Ambulatory Visit | Attending: Pain Medicine | Admitting: Pain Medicine

## 2019-02-27 ENCOUNTER — Ambulatory Visit (HOSPITAL_BASED_OUTPATIENT_CLINIC_OR_DEPARTMENT_OTHER): Payer: Medicare Other | Admitting: Pain Medicine

## 2019-02-27 VITALS — BP 114/68 | HR 46 | Temp 98.4°F | Resp 13 | Ht 60.0 in | Wt 165.0 lb

## 2019-02-27 DIAGNOSIS — Z888 Allergy status to other drugs, medicaments and biological substances status: Secondary | ICD-10-CM | POA: Insufficient documentation

## 2019-02-27 DIAGNOSIS — M47812 Spondylosis without myelopathy or radiculopathy, cervical region: Secondary | ICD-10-CM | POA: Diagnosis not present

## 2019-02-27 DIAGNOSIS — Z91040 Latex allergy status: Secondary | ICD-10-CM

## 2019-02-27 DIAGNOSIS — Z91041 Radiographic dye allergy status: Secondary | ICD-10-CM | POA: Insufficient documentation

## 2019-02-27 DIAGNOSIS — M542 Cervicalgia: Secondary | ICD-10-CM | POA: Diagnosis not present

## 2019-02-27 DIAGNOSIS — M5030 Other cervical disc degeneration, unspecified cervical region: Secondary | ICD-10-CM | POA: Insufficient documentation

## 2019-02-27 MED ORDER — ROPIVACAINE HCL 2 MG/ML IJ SOLN
18.0000 mL | Freq: Once | INTRAMUSCULAR | Status: AC
  Administered 2019-02-27: 18 mL via PERINEURAL
  Filled 2019-02-27: qty 20

## 2019-02-27 MED ORDER — LACTATED RINGERS IV SOLN
1000.0000 mL | Freq: Once | INTRAVENOUS | Status: AC
  Administered 2019-02-27: 1000 mL via INTRAVENOUS

## 2019-02-27 MED ORDER — LIDOCAINE HCL 2 % IJ SOLN
20.0000 mL | Freq: Once | INTRAMUSCULAR | Status: AC
  Administered 2019-02-27: 09:00:00 400 mg
  Filled 2019-02-27: qty 20

## 2019-02-27 MED ORDER — DEXAMETHASONE SODIUM PHOSPHATE 10 MG/ML IJ SOLN
20.0000 mg | Freq: Once | INTRAMUSCULAR | Status: AC
  Administered 2019-02-27: 20 mg
  Filled 2019-02-27: qty 2

## 2019-02-27 MED ORDER — MIDAZOLAM HCL 5 MG/5ML IJ SOLN
1.0000 mg | INTRAMUSCULAR | Status: DC | PRN
  Administered 2019-02-27: 2 mg via INTRAVENOUS
  Filled 2019-02-27: qty 5

## 2019-02-27 MED ORDER — FENTANYL CITRATE (PF) 100 MCG/2ML IJ SOLN
25.0000 ug | INTRAMUSCULAR | Status: DC | PRN
  Administered 2019-02-27: 50 ug via INTRAVENOUS
  Filled 2019-02-27: qty 2

## 2019-02-27 NOTE — Progress Notes (Signed)
Approx. [id number] patients sat decreased to the low 80's Unable to move head r/t to procedure still in progress. Dr Dossie Arbour immediately injected medication and removed needles. Bed was called and flipped to back and send to recovery room.

## 2019-02-27 NOTE — Progress Notes (Signed)
Safety precautions to be maintained throughout the outpatient stay will include: orient to surroundings, keep bed in low position, maintain call bell within reach at all times, provide assistance with transfer out of bed and ambulation.  

## 2019-02-28 ENCOUNTER — Telehealth: Payer: Self-pay | Admitting: *Deleted

## 2019-02-28 NOTE — Telephone Encounter (Signed)
States had a slight headache this morning that is going away. Still feels popping at top of neck when turning her head but was told it was arthritis. Encouraged to use heat today and increase fluids. Call for any issues or concerns.

## 2019-03-13 ENCOUNTER — Telehealth: Payer: Self-pay | Admitting: *Deleted

## 2019-03-13 NOTE — Telephone Encounter (Signed)
Attempted to call for pre appointment assessment. Message left, requested pt to call our office.

## 2019-03-15 NOTE — Progress Notes (Signed)
Pain Management Virtual Encounter Note - Virtual Visit via Telephone Telehealth (real-time audio visits between healthcare provider and patient).   Patient's Phone No. & Preferred Pharmacy:  [phone] (home); [phone] (mobile); (Preferred) [phone] No e-mail address on record  [place], [place] - [address] [place] [address] Phone: [phone] Fax: [phone]    Pre-screening note:  Our staff contacted Ms. Casstevens and offered her an "in person", "face-to-face" appointment versus a telephone encounter. She indicated preferring the telephone encounter, at this time.   Reason for Virtual Visit: COVID-19*  Social distancing based on CDC and AMA recommendations.   I contacted Saphyra Hutt on 03/17/2019 via telephone.      I clearly identified myself as Gaspar Cola, MD. I verified that I was speaking with the correct person using two identifiers (Name: Lissie Hinesley, and date of birth: 07-02-1951).  Advanced Informed Consent I sought verbal advanced consent from Willey Blade for virtual visit interactions. I informed Ms. Hipps of possible security and privacy concerns, risks, and limitations associated with providing "not-in-person" medical evaluation and management services. I also informed Ms. Kaylor of the availability of "in-person" appointments. Finally, I informed her that there would be a charge for the virtual visit and that she could be  personally, fully or partially, financially responsible for it. Ms. Ranganathan expressed understanding and agreed to proceed.   Historic Elements   Ms. Coleen Cardiff is a 68 y.o. year old, female patient evaluated today after her last encounter by our practice on 03/13/2019. Ms. Anspach  has a past medical history of Chronic constipation, Fatty liver disease, nonalcoholic, Fibromyalgia, GERD (gastroesophageal reflux disease), History of hiatal hernia, and Hypertension. She also  has a past  surgical history that includes Colonoscopy with propofol (N/A, 09/27/2015). Ms. Quizon has a current medication list which includes the following prescription(s): albuterol, amlodipine, calcium carbonate, cetirizine, cholecalciferol, diphenhydramine, evening primrose oil, famotidine, fluconazole, fluticasone, gabapentin, magnesium oxide, melatonin, metoprolol tartrate, multivitamin, polyethylene glycol, and sucralfate. She  reports that she has never smoked. She has never used smokeless tobacco. She reports that she does not drink alcohol or use drugs. Ms. Chanda is allergic to dicyclomine; minoxidil; triamcinolone acetonide; valacyclovir; amantadines; cymbalta [duloxetine hcl]; erythromycin; hydrochlorothiazide; iodine; meloxicam; nexium [esomeprazole magnesium]; prilosec [omeprazole]; savella [milnacipran hcl]; cleocin [clindamycin hcl]; fluocinolone acetonide; latex; and tramadol.   HPI  Today, she is being contacted for a post-procedure assessment.  Post-Procedure Evaluation  Procedure: Diagnostic bilateral cervical facet nerve block #1  Pre-procedure pain level:  4/10 Post-procedure: 0/10          Sedation: Sedation provided.  Effectiveness during initial hour after procedure(Ultra-Short Term Relief): 90 %   Local anesthetic used: Long-acting (4-6 hours) Effectiveness: Defined as any analgesic benefit obtained secondary to the administration of local anesthetics. This carries significant diagnostic value as to the etiological location, or anatomical origin, of the pain. Duration of benefit is expected to coincide with the duration of the local anesthetic used.  Effectiveness during initial 4-6 hours after procedure(Short-Term Relief): 90 %   Long-term benefit: Defined as any relief past the pharmacologic duration of the local anesthetics.  Effectiveness past the initial 6 hours after procedure(Long-Term Relief): 75 %(continues to have neck stiffness and a headache.  pain has not been intense as  it was previously)   Current benefits: Defined as benefit that persist at this time.   Analgesia:  75% Function: Ms. Youngren reports improvement in function ROM: Ms. Cumberland reports improvement in  ROM  Pharmacotherapy Assessment  Analgesic: Not using opioid analgesics.    Monitoring: Pharmacotherapy: No side-effects or adverse reactions reported. [place] PMP: PDMP reviewed during this encounter.       Compliance: No problems identified. Effectiveness: Clinically acceptable. Plan: Refer to "POC".  Pertinent Labs   SAFETY SCREENING Profile Lab Results  Component Value Date   SARSCOV2NAA NEGATIVE 02/24/2019   Renal Function Lab Results  Component Value Date   BUN 16 12/11/2017   CREATININE 1.04 (H) 12/11/2017   BCR 15 12/11/2017   GFRAA 65 12/11/2017   GFRNONAA 56 (L) 12/11/2017   Hepatic Function Lab Results  Component Value Date   AST 22 12/11/2017   ALBUMIN 4.2 12/11/2017   UDS Summary  Date Value Ref Range Status  12/10/2017 FINAL  Final    Comment:    ==================================================================== TOXASSURE COMP DRUG ANALYSIS,UR ==================================================================== Test                             Result       Flag       Units Drug Present and Declared for Prescription Verification   Gabapentin                     PRESENT      EXPECTED Drug Present not Declared for Prescription Verification   Metoprolol                     PRESENT      UNEXPECTED Drug Absent but Declared for Prescription Verification   Cyclobenzaprine                Not Detected UNEXPECTED   Diphenhydramine                Not Detected UNEXPECTED ==================================================================== Test                      Result    Flag   Units      Ref Range   Creatinine              118              mg/dL      >=20 ==================================================================== Declared Medications:  The flagging and  interpretation on this report are based on the  following declared medications.  Unexpected results may arise from  inaccuracies in the declared medications.  **Note: The testing scope of this panel includes these medications:  Cyclobenzaprine  Diphenhydramine  Gabapentin  **Note: The testing scope of this panel does not include following  reported medications:  Albuterol  Amlodipine  Calcium carbonate  Cetirizine  Cholecalciferol  Fluconazole  Fluticasone  Multivitamin  Polyethylene Glycol  Sucralfate  Supplement (Primrose) ==================================================================== For clinical consultation, please call [phone]. ====================================================================    Note: Above Lab results reviewed.  Recent imaging  DG PAIN CLINIC C-ARM 1-60 MIN NO REPORT Fluoro was used, but no Radiologist interpretation will be provided.  Please refer to "NOTES" tab for provider progress note.  Assessment  The primary encounter diagnosis was Cervical facet syndrome (Bilateral) (R>L). Diagnoses of Cervicalgia, Chronic pain syndrome, and Chronic upper extremity pain (Fourth Area of Pain) (Bilateral) (R>L) were also pertinent to this visit.  Plan of Care  I am having Heavenlee Maiorana maintain her albuterol, EVENING PRIMROSE OIL PO, calcium carbonate, cetirizine, cholecalciferol, diphenhydrAMINE, fluconazole, fluticasone, multivitamin, sucralfate, amLODipine, polyethylene glycol, metoprolol tartrate, famotidine, Magnesium Oxide, Melatonin,  and gabapentin.  Pharmacotherapy (Medications Ordered): No orders of the defined types were placed in this encounter.  Orders:  No orders of the defined types were placed in this encounter.  Follow-up plan:   Return in 19 weeks (on 07/28/2019), or if symptoms worsen or fail to improve, for (VV), E/M (MM).      Considering:   NOTE: KENALOG (Use Decadron), IODINE, CONTRAST, and LATEX Allergy. Possible  bilateral lumbar facet RFA Diagnostic right-sided sacroiliac joint block Possible right-sided sacroiliac joint RFA DiagnosticrightCESI Possible bilateral cervical facet RFA Diagnostic bilateral L4 TFESI Diagnostic right-sided L3-4 LESI Diagnostic TPI/MNB Diagnosticlidocaine infusion   Palliative PRN treatment(s):   Palliative/therapeutic left L4-5 LESI #3 (using Decadron) Diagnostic bilateral lumbar facet block #2 Diagnostic bilateral cervical facet nerve block #2       Recent Visits Date Type Provider Dept  02/27/19 Procedure visit Milinda Pointer, MD [facility]  02/19/19 Office Visit Milinda Pointer, MD [facility]  Showing recent visits within past 90 days and meeting all other requirements   Today's Visits Date Type Provider Dept  03/17/19 Office Visit Milinda Pointer, MD [facility]  Showing today's visits and meeting all other requirements   Future Appointments No visits were found meeting these conditions.  Showing future appointments within next 90 days and meeting all other requirements   I discussed the assessment and treatment plan with the patient. The patient was provided an opportunity to ask questions and all were answered. The patient agreed with the plan and demonstrated an understanding of the instructions.  Patient advised to call back or seek an in-person evaluation if the symptoms or condition worsens.  Total duration of non-face-to-face encounter: 12 minutes.  Note by: Gaspar Cola, MD Date: 03/17/2019; Time: 9:06 AM  Note: This dictation was prepared with Dragon dictation. Any transcriptional errors that may result from this process are unintentional.  Disclaimer:  * Given the special circumstances of the COVID-19 pandemic, the federal government has announced that the Office for Civil Rights (OCR) will exercise its enforcement discretion and will not impose penalties on physicians using  telehealth in the event of noncompliance with regulatory requirements under the [place] (HIPAA) in connection with the good faith provision of telehealth during the AFBXU-38 national public health emergency. ([place])

## 2019-03-17 ENCOUNTER — Ambulatory Visit: Payer: Medicare Other | Attending: Pain Medicine | Admitting: Pain Medicine

## 2019-03-17 ENCOUNTER — Other Ambulatory Visit: Payer: Self-pay

## 2019-03-17 ENCOUNTER — Encounter: Payer: Self-pay | Admitting: Pain Medicine

## 2019-03-17 DIAGNOSIS — M47812 Spondylosis without myelopathy or radiculopathy, cervical region: Secondary | ICD-10-CM | POA: Diagnosis not present

## 2019-03-17 DIAGNOSIS — M79601 Pain in right arm: Secondary | ICD-10-CM

## 2019-03-17 DIAGNOSIS — G894 Chronic pain syndrome: Secondary | ICD-10-CM

## 2019-03-17 DIAGNOSIS — M542 Cervicalgia: Secondary | ICD-10-CM

## 2019-03-17 DIAGNOSIS — G8929 Other chronic pain: Secondary | ICD-10-CM

## 2019-03-17 DIAGNOSIS — M79602 Pain in left arm: Secondary | ICD-10-CM

## 2019-07-14 ENCOUNTER — Telehealth: Payer: Medicare Other | Admitting: Pain Medicine

## 2019-07-24 ENCOUNTER — Encounter: Payer: Self-pay | Admitting: Pain Medicine

## 2019-07-27 NOTE — Progress Notes (Signed)
Pain Management Virtual Encounter Note - Virtual Visit via Telephone Telehealth (real-time audio visits between healthcare provider and patient).   Patient's Phone No. & Preferred Pharmacy:  [phone] (home); [phone] (mobile); (Preferred) [phone] No e-mail address on record  [place], [place] - [address] [place] [address] Phone: [phone] Fax: [phone]    Pre-screening note:  Our staff contacted Ms. Cloke and offered her an "in person", "face-to-face" appointment versus a telephone encounter. She indicated preferring the telephone encounter, at this time.   Reason for Virtual Visit: COVID-19*  Social distancing based on CDC and AMA recommendations.   I contacted Alyrica Semon on 07/28/2019 via telephone.      I clearly identified myself as Gaspar Cola, MD. I verified that I was speaking with the correct person using two identifiers (Name: Atarah Grunder, and date of birth: [birth year]).  Advanced Informed Consent I sought verbal advanced consent from Willey Blade for virtual visit interactions. I informed Ms. Reitsma of possible security and privacy concerns, risks, and limitations associated with providing "not-in-person" medical evaluation and management services. I also informed Ms. Brandin of the availability of "in-person" appointments. Finally, I informed her that there would be a charge for the virtual visit and that she could be  personally, fully or partially, financially responsible for it. Ms. Deluccia expressed understanding and agreed to proceed.   Historic Elements   Ms. Tandy Zoto is a 68 y.o. year old, female patient evaluated today after her last encounter by our practice on 03/17/2019. Ms. Dombrowski  has a past medical history of Chronic constipation, Fatty liver disease, nonalcoholic, Fibromyalgia, GERD (gastroesophageal reflux disease), History of hiatal hernia, and Hypertension. She also  has a past  surgical history that includes Colonoscopy with propofol (N/A, 09/27/2015). Ms. Stclair has a current medication list which includes the following prescription(s): albuterol, amlodipine, calcium carbonate, cetirizine, cholecalciferol, diphenhydramine, evening primrose oil, famotidine, fluconazole, fluticasone, [START ON 08/18/2019] gabapentin, magnesium oxide, [START ON 08/18/2019] melatonin, metoprolol tartrate, multivitamin, polyethylene glycol, and sucralfate. She  reports that she has never smoked. She has never used smokeless tobacco. She reports that she does not drink alcohol or use drugs. Ms. Foley is allergic to dicyclomine; minoxidil; triamcinolone acetonide; valacyclovir; amantadines; cymbalta [duloxetine hcl]; erythromycin; hydrochlorothiazide; iodine; meloxicam; nexium [esomeprazole magnesium]; prilosec [omeprazole]; savella [milnacipran hcl]; cleocin [clindamycin hcl]; fluocinolone acetonide; latex; and tramadol.   HPI  Today, she is being contacted for medication management.  The patient indicates doing well with the current medication regimen. No adverse reactions or side effects reported to the medications.  In addition to this, just as a FYI, she indicated that another one of her physicians had given her some steroids to take after she had a fall and hit her back.  She indicates that she did not take the steroids at that time because she was concerned about her blood sugar going up and she was also having some stomach problems.  Now that she is over the stomach problems, she indicated that she may be taking the steroids.  She asked me if I had any contraindications for her not to take the steroids and I indicated to her that I was well aware that the steroids would increase her blood sugar, but since I had not prescribed them I thought that she should be asking the prescriber for any questions that she may have regarding whether or not she needs to go ahead and take him despite the fact that it has  been sometime since she had a prescribed to her.  I asked her if she was having any pain in the back and she indicated that she was still having some of that pain and I told her that based on that, she would probably still have the indications to take the medication.  However, I also reminded the patient that it is true that the steroids would increase her blood sugar and for the answer to whether or not she should take it based on that particular concern, she should talk to her primary care physician to see if they have any concerns regarding her diabetes and taking a short course of steroids.  The patient denied any history of diabetic ketoacidosis or diabetic coma and therefore from that standpoint, it would probably be okay for her to take it, however the final say so on that should be from the prescribing physician.  She understood and accepted.  She also mentioned that she is schedule to undergo an upper endoscopy around October 29, 2019.  I told her that there was no contraindication to doing that based on what we were treating her for.  Pharmacotherapy Assessment  Analgesic: Not using opioid analgesics.   Monitoring: Pharmacotherapy: No side-effects or adverse reactions reported.  PMP: PDMP reviewed during this encounter.       Compliance: No problems identified. Effectiveness: Clinically acceptable. Plan: Refer to "POC".  UDS:  Summary  Date Value Ref Range Status  12/10/2017 FINAL  Final    Comment:    ==================================================================== TOXASSURE COMP DRUG ANALYSIS,UR ==================================================================== Test                             Result       Flag       Units Drug Present and Declared for Prescription Verification   Gabapentin                     PRESENT      EXPECTED Drug Present not Declared for Prescription Verification   Metoprolol                     PRESENT      UNEXPECTED Drug Absent but Declared for  Prescription Verification   Cyclobenzaprine                Not Detected UNEXPECTED   Diphenhydramine                Not Detected UNEXPECTED ==================================================================== Test                      Result    Flag   Units      Ref Range   Creatinine              118              mg/dL      >=20 ==================================================================== Declared Medications:  The flagging and interpretation on this report are based on the  following declared medications.  Unexpected results may arise from  inaccuracies in the declared medications.  **Note: The testing scope of this panel includes these medications:  Cyclobenzaprine  Diphenhydramine  Gabapentin  **Note: The testing scope of this panel does not include following  reported medications:  Albuterol  Amlodipine  Calcium carbonate  Cetirizine  Cholecalciferol  Fluconazole  Fluticasone  Multivitamin  Polyethylene Glycol  Sucralfate  Supplement (Primrose) ==================================================================== For  clinical consultation, please call [phone]. ====================================================================    Laboratory Chemistry Profile (12 mo)  Renal: No results found for requested labs within last 8760 hours.  Lab Results  Component Value Date   GFRAA 65 12/11/2017   GFRNONAA 56 (L) 12/11/2017   Hepatic: No results found for requested labs within last 8760 hours. Lab Results  Component Value Date   AST 22 12/11/2017   Other: No results found for requested labs within last 8760 hours. Note: Above Lab results reviewed.  Imaging  DG PAIN CLINIC C-ARM 1-60 MIN NO REPORT Fluoro was used, but no Radiologist interpretation will be provided.  Please refer to "NOTES" tab for provider progress note.   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  Plan of Care   Problem-specific:  No problem-specific Assessment & Plan notes found for this encounter.  I have changed Emelda Tuff's gabapentin. I am also having her maintain her albuterol, EVENING PRIMROSE OIL PO, calcium carbonate, cetirizine, cholecalciferol, diphenhydrAMINE, fluconazole, fluticasone, multivitamin, sucralfate, amLODipine, polyethylene glycol, metoprolol tartrate, famotidine, Magnesium Oxide, and Melatonin.  Pharmacotherapy (Medications Ordered): Meds ordered this encounter  Medications  . Melatonin 10 MG CAPS    Sig: Take 20 mg by mouth at bedtime as needed.    Dispense:  60 capsule    Refill:  11    Fill one day early if pharmacy is closed on scheduled refill date. May substitute for generic if available.  . gabapentin (NEURONTIN) 300 MG capsule    Sig: Take 1 capsule (300 mg total) by mouth 2 (two) times daily AND 2 capsules (600 mg total) at bedtime.    Dispense:  120 capsule    Refill:  5    Fill one day early if pharmacy is closed on scheduled refill date. May substitute for generic if available.   Orders:  No orders of the defined types were placed in this encounter.  Follow-up plan:   No follow-ups on file.      Considering:   NOTE: KENALOG (Use Decadron), IODINE, CONTRAST, and LATEX Allergy. Possible bilateral lumbar facet RFA Diagnostic right-sided sacroiliac joint block Possible right-sided sacroiliac joint RFA DiagnosticrightCESI Possible bilateral cervical facet RFA Diagnostic bilateral L4 TFESI Diagnostic right-sided L3-4 LESI Diagnostic TPI/MNB Diagnosticlidocaine infusion   Palliative PRN treatment(s):   Palliative/therapeutic left L4-5 LESI #3 (using Decadron) Diagnostic bilateral lumbar facet block #2 Diagnostic bilateral cervical facet nerve block #2     Recent Visits No visits were found meeting these conditions.  Showing recent visits within past 90 days and meeting all other requirements   Today's Visits Date Type Provider Dept   07/28/19 Telemedicine Milinda Pointer, MD [facility]  Showing today's visits and meeting all other requirements   Future Appointments No visits were found meeting these conditions.  Showing future appointments within next 90 days and meeting all other requirements   I discussed the assessment and treatment plan with the patient. The patient was provided an opportunity to ask questions and all were answered. The patient agreed with the plan and demonstrated an understanding of the instructions.  Patient advised to call back or seek an in-person evaluation if the symptoms or condition worsens.  Total duration of non-face-to-face encounter: 15 minutes.  Note by: Gaspar Cola, MD Date: 07/28/2019; Time: 12:51 PM  Note: This dictation was prepared with Dragon dictation. Any transcriptional errors that may result from this process are unintentional.

## 2019-07-28 ENCOUNTER — Ambulatory Visit: Payer: Medicare Other | Attending: Pain Medicine | Admitting: Pain Medicine

## 2019-07-28 ENCOUNTER — Other Ambulatory Visit: Payer: Self-pay

## 2019-07-28 DIAGNOSIS — G8929 Other chronic pain: Secondary | ICD-10-CM

## 2019-07-28 DIAGNOSIS — M792 Neuralgia and neuritis, unspecified: Secondary | ICD-10-CM

## 2019-07-28 DIAGNOSIS — G4701 Insomnia due to medical condition: Secondary | ICD-10-CM | POA: Diagnosis not present

## 2019-07-28 MED ORDER — GABAPENTIN 300 MG PO CAPS: ORAL_CAPSULE | ORAL | 5 refills | Status: DC

## 2019-07-28 MED ORDER — MELATONIN 10 MG PO CAPS: 20.0000 mg | ORAL_CAPSULE | Freq: Every evening | ORAL | 11 refills | Status: DC | PRN

## 2019-07-30 ENCOUNTER — Ambulatory Visit: Payer: Medicare Other | Admitting: Pain Medicine

## 2019-08-04 ENCOUNTER — Emergency Department: Payer: Medicare Other

## 2019-08-04 ENCOUNTER — Emergency Department
Admission: EM | Admit: 2019-08-04 | Discharge: 2019-08-04 | Disposition: A | Discharge status: Home / Self Care | Payer: Medicare Other | Attending: Emergency Medicine | Admitting: Emergency Medicine

## 2019-08-04 ENCOUNTER — Other Ambulatory Visit: Payer: Self-pay

## 2019-08-04 DIAGNOSIS — I10 Essential (primary) hypertension: Secondary | ICD-10-CM | POA: Diagnosis not present

## 2019-08-04 DIAGNOSIS — T50905A Adverse effect of unspecified drugs, medicaments and biological substances, initial encounter: Secondary | ICD-10-CM

## 2019-08-04 DIAGNOSIS — R55 Syncope and collapse: Secondary | ICD-10-CM | POA: Diagnosis not present

## 2019-08-04 DIAGNOSIS — T380X5A Adverse effect of glucocorticoids and synthetic analogues, initial encounter: Secondary | ICD-10-CM | POA: Insufficient documentation

## 2019-08-04 DIAGNOSIS — R1032 Left lower quadrant pain: Secondary | ICD-10-CM | POA: Insufficient documentation

## 2019-08-04 DIAGNOSIS — Z91040 Latex allergy status: Secondary | ICD-10-CM | POA: Insufficient documentation

## 2019-08-04 DIAGNOSIS — R001 Bradycardia, unspecified: Secondary | ICD-10-CM

## 2019-08-04 DIAGNOSIS — Z79899 Other long term (current) drug therapy: Secondary | ICD-10-CM | POA: Insufficient documentation

## 2019-08-04 LAB — BASIC METABOLIC PANEL
Anion gap: 12 (ref 5–15)
BUN: 29 mg/dL — ABNORMAL HIGH (ref 8–23)
CO2: 22 mmol/L (ref 22–32)
Calcium: 9.2 mg/dL (ref 8.9–10.3)
Chloride: 103 mmol/L (ref 98–111)
Creatinine, Ser: 0.98 mg/dL (ref 0.44–1.00)
GFR calc Af Amer: >60 mL/min (ref >60)
GFR calc non Af Amer: 59 mL/min — ABNORMAL LOW (ref >60)
Glucose, Bld: 119 mg/dL — ABNORMAL HIGH (ref 70–99)
Potassium: 3.9 mmol/L (ref 3.5–5.1)
Sodium: 137 mmol/L (ref 135–145)

## 2019-08-04 LAB — TROPONIN I (HIGH SENSITIVITY)
Troponin I (High Sensitivity): 5 ng/L (ref <18)
Troponin I (High Sensitivity): 6 ng/L (ref <18)

## 2019-08-04 LAB — CBC
HCT: 40.2 % (ref 36.0–46.0)
Hemoglobin: 14.3 g/dL (ref 12.0–15.0)
MCH: 28.3 pg (ref 26.0–34.0)
MCHC: 35.6 g/dL (ref 30.0–36.0)
MCV: 79.4 fL — ABNORMAL LOW (ref 80.0–100.0)
Platelets: 340 10*3/uL (ref 150–400)
RBC: 5.06 MIL/uL (ref 3.87–5.11)
RDW: 14.7 % (ref 11.5–15.5)
WBC: 13.6 10*3/uL — ABNORMAL HIGH (ref 4.0–10.5)
nRBC: 0 % (ref 0.0–0.2)

## 2019-08-04 LAB — HEPATIC FUNCTION PANEL
ALT: 16 U/L (ref 0–44)
AST: 22 U/L (ref 15–41)
Albumin: 3.9 g/dL (ref 3.5–5.0)
Alkaline Phosphatase: 66 U/L (ref 38–126)
Bilirubin, Direct: 0.1 mg/dL (ref 0.0–0.2)
Indirect Bilirubin: 0.4 mg/dL (ref 0.3–0.9)
Total Bilirubin: 0.5 mg/dL (ref 0.3–1.2)
Total Protein: 7.4 g/dL (ref 6.5–8.1)

## 2019-08-04 LAB — LIPASE, BLOOD: Lipase: 31 U/L (ref 11–51)

## 2019-08-04 NOTE — ED Notes (Signed)
Pt reports taking prednisone and has abd pain from taking prednisone.  Pt had a syncopal episode today and hit her head on the floor.  No n/v/  Pt denies chest pain or sob.  Pt states her abd still aches.  Pt alert speech clear.  Sinus brady on monitor.

## 2019-08-04 NOTE — ED Notes (Signed)
Pt up to bathroom with assistance 

## 2019-08-04 NOTE — Discharge Instructions (Addendum)
For now:  - STOP the metoprolol for the next 48 hours; call your PCP to discuss restarting this - STOP the prednisone - START taking the Miralax twice a day until you have soft stools  Call your doctor tomorrow to discuss your visit  Drink plenty of fluid

## 2019-08-04 NOTE — ED Triage Notes (Signed)
Reports took a prednisone, started having abdominal pain and then had syncopal episode with head injury. States she does not remember anything after feeling hot flushed, abdominal pain and then remember daughter waking her up ground. Unwitnessed fall. Pt alert and oriented x 4 at this time. Denies any head pain since fall, no swelling present-no new neck pain, states hx of chronic neck pain.

## 2019-08-04 NOTE — ED Provider Notes (Signed)
[facility] Emergency Department Provider Note  ____________________________________________   First MD Initiated Contact with Patient 08/04/19 1816     (approximate)  I have reviewed the triage vital signs and the nursing notes.   HISTORY  Chief Complaint Loss of Consciousness    HPI Andrea Yang is a 68 y.o. female  HTN, fibromyalgia, here with syncopal episode. Pt reports that recently she was started on prednisone for lower back pain, which is chronic. Since then, she's had intermittent cramping, diffuse but primarily LLQ abd pain that is worse with palpation, movement, and eating. Pain has been intermittent but worsening. She has been drinking and eating less due to this. Earlier today, pt was working in her kitchen when she began to feel her pain getting worse. She then felt a "wave" of warmth come over her with associated diaphoresis. Denies any CP with this. She states she believes she then passed out and lost consciousness. Immediately regained consciousness. She has some mild upper neck pain 2/2 fall, no numbness or weakness. No alleviating factors.        Past Medical History:  Diagnosis Date  . Chronic constipation   . Fatty liver disease, nonalcoholic   . Fibromyalgia   . GERD (gastroesophageal reflux disease)   . History of hiatal hernia   . Hypertension     Patient Active Problem List   Diagnosis Date Noted  . Spondylosis without myelopathy or radiculopathy, cervical region 02/26/2019  . Insomnia secondary to chronic pain 08/19/2018  . History of Iodine Allergy (Precautions) 07/18/2018    Class: History of  . History of allergy to contrast 07/18/2018  . Latex precautions, history of latex allergy 07/18/2018  . Abnormal MRI, cervical spine (05/15/2018) 07/08/2018  . Cervical facet arthropathy (Multilevel) (Bilateral) 07/08/2018  . Abnormal MRI, lumbar spine (05/15/2018) 07/08/2018  . Lumbar facet arthropathy (L2-3, L3-4, L4-5, L5-S1)  05/22/2018  . Lumbar foraminal stenosis (Multilevel) (Bilateral: L2-3, L3-4, L4-5, L5-S1) (severe at right L4-5) 05/22/2018  . Elevated C-reactive protein (CRP) 05/06/2018  . Osteoarthritis of lumbar spine 05/06/2018  . 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, L5-S1) (Bilateral) 05/06/2018  . Lumbar facet syndrome (Bilateral) (R>L) 05/06/2018  . Spondylosis without myelopathy or radiculopathy, lumbosacral region 05/06/2018  . Other specified dorsopathies, sacral and sacrococcygeal region 05/06/2018  . Lumbar Levoscoliosis 05/06/2018  . Lumbar Grade 1 (4 mm) Anterolisthesis of L4/L5 on flexion 05/06/2018  . Lumbar spine instability (L4 over L5) (4 mm displacement on flexion) 05/06/2018  . DDD (degenerative disc disease), lumbar 05/06/2018  . DDD (degenerative disc disease), cervical 05/06/2018  . Cervical facet hypertrophy (Bilateral) 05/06/2018  . 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 Cervical facet syndrome (Bilateral) (R>L) 05/06/2018  . Cervical radiculitis (Bilateral) (R>L) 05/06/2018  . Lumbar radiculitis (Bilateral) (R>L) 05/06/2018  . Decreased glomerular filtration rate (GFR) 05/06/2018  . Other intervertebral disc degeneration, lumbar region 05/06/2018  . Chronic upper extremity pain (Fourth Area of Pain) (Bilateral) (R>L) 04/29/2018  . Chronic low back pain (Primary Area of Pain) (Bilateral) (R>L) 04/29/2018  . Chronic generalized pain 04/29/2018  . Chronic musculoskeletal pain 04/29/2018  . Neurogenic pain 04/29/2018  . Cervicalgia 04/29/2018  . Chronic sacroiliac joint sclerosis (Right) 04/29/2018  . Chronic low back pain (Primary Area of Pain) (Bilateral) (R>L) w/ sciatica (Bilateral) 12/10/2017  . Chronic lower extremity pain (Secondary Area of Pain) (Bilateral) (R>L) 12/10/2017  . Chronic neck pain [facility] Area of Pain) (Bilateral) (R>L) 12/10/2017  . Chronic  pain syndrome 12/10/2017  . Pharmacologic therapy 12/10/2017   . Disorder of skeletal system 12/10/2017  . Problems influencing health status 12/10/2017  . Chronic sacroiliac joint pain (Right) 12/10/2017  . 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 Chronic abdominal pain 06/08/2015  . Chronic constipation 06/08/2015  . Fibromyalgia syndrome 06/08/2015    Past Surgical History:  Procedure Laterality Date  . COLONOSCOPY WITH PROPOFOL N/A 09/27/2015   Procedure: COLONOSCOPY WITH PROPOFOL;  Surgeon: Hulen Luster, MD;  Location: [facility] ENDOSCOPY;  Service: Gastroenterology;  Laterality: N/A;    Prior to Admission medications   Medication Sig Start Date End Date Taking? Authorizing Provider  albuterol (PROVENTIL HFA;VENTOLIN HFA) 108 (90 Base) MCG/ACT inhaler Inhale 2 puffs into the lungs every 6 (six) hours as needed for wheezing or shortness of breath.    [provider]  amLODipine (NORVASC) 2.5 MG tablet Take 2.5 mg by mouth daily.    [provider]  calcium carbonate (OSCAL) 1500 (600 Ca) MG TABS tablet Take by mouth 2 (two) times daily with a meal.    [provider]  cetirizine (ZYRTEC) 10 MG tablet Take 10 mg by mouth daily.    [provider]  cholecalciferol (VITAMIN D) 400 units TABS tablet Take 400 Units by mouth.    [provider]  diphenhydrAMINE (BENADRYL) 50 MG tablet Take 50 mg by mouth at bedtime as needed for itching.    [provider]  EVENING PRIMROSE OIL PO Take by mouth daily at 6 (six) AM.    [provider]  famotidine (PEPCID) 10 MG tablet Take 10 mg by mouth 2 (two) times daily.    [provider]  fluconazole (DIFLUCAN) 150 MG tablet Take 150 mg by mouth daily. Reported on 09/27/2015    [provider]  fluticasone (FLONASE) 50 MCG/ACT nasal spray Place 2 sprays into both nostrils daily.    [provider]  gabapentin (NEURONTIN) 300 MG capsule Take 1 capsule (300 mg total) by mouth 2 (two)  times daily AND 2 capsules (600 mg total) at bedtime. 08/18/19 02/14/20  Milinda Pointer, MD  Magnesium Oxide 500 MG (LAX) TABS Take 500 mg by mouth at bedtime.    [provider]  Melatonin 10 MG CAPS Take 20 mg by mouth at bedtime as needed. 08/18/19 08/17/20  Milinda Pointer, MD  Multiple Vitamin (MULTIVITAMIN) tablet Take 1 tablet by mouth daily.    [provider]  polyethylene glycol (MIRALAX / GLYCOLAX) packet Take 1 packet by mouth as needed. 02/22/16   [provider]  sucralfate (CARAFATE) 1 g tablet Take 1 g by mouth daily.    [provider]  metoprolol tartrate (LOPRESSOR) 25 MG tablet Take 25 mg by mouth 2 (two) times daily.   08/04/19  [provider]    Allergies Dicyclomine, Minoxidil, Triamcinolone acetonide, Valacyclovir, Amantadines, Cymbalta [duloxetine hcl], Erythromycin, Hydrochlorothiazide, Iodine, Meloxicam, Nexium [esomeprazole magnesium], Prilosec [omeprazole], Savella [milnacipran hcl], Cleocin [clindamycin hcl], Fluocinolone acetonide, Latex, and Tramadol  No family history on file.  Social History Social History   Tobacco Use  . Smoking status: Never Smoker  . Smokeless tobacco: Never Used  Substance Use Topics  . Alcohol use: No  . Drug use: No    Review of Systems  Review of Systems  Constitutional: Positive for fatigue. Negative for chills and fever.  HENT: Negative for sore throat.   Respiratory: Negative for shortness of breath.   Cardiovascular: Negative  for chest pain.  Gastrointestinal: Positive for abdominal pain and nausea.  Genitourinary: Negative for flank pain.  Musculoskeletal: Negative for neck pain.  Skin: Negative for rash and wound.  Allergic/Immunologic: Negative for immunocompromised state.  Neurological: Positive for syncope and weakness. Negative for numbness.  Hematological: Does not bruise/bleed easily.     ____________________________________________  PHYSICAL EXAM:      VITAL  SIGNS: ED Triage Vitals  Enc Vitals Group     BP 08/04/19 1630 (!) 159/55     Pulse Rate 08/04/19 1630 (!) 45     Resp 08/04/19 1630 18     Temp 08/04/19 1630 98.7 F (37.1 C)     Temp Source 08/04/19 1630 Oral     SpO2 08/04/19 1630 99 %     Weight 08/04/19 1632 168 lb (76.2 kg)     Height 08/04/19 1632 5\' 1"  (1.549 m)     Head Circumference --      Peak Flow --      Pain Score 08/04/19 1637 5     Pain Loc --      Pain Edu? --      Excl. in [place]? --      Physical Exam Vitals and nursing note reviewed.  Constitutional:      General: She is not in acute distress.    Appearance: She is well-developed.  HENT:     Head: Normocephalic and atraumatic.     Mouth/Throat:     Mouth: Mucous membranes are dry.  Eyes:     Conjunctiva/sclera: Conjunctivae normal.  Cardiovascular:     Rate and Rhythm: Normal rate and regular rhythm.     Heart sounds: Normal heart sounds. No murmur. No friction rub.  Pulmonary:     Effort: Pulmonary effort is normal. No respiratory distress.     Breath sounds: Normal breath sounds. No wheezing or rales.  Abdominal:     General: Abdomen is flat. There is no distension.     Palpations: Abdomen is soft.     Tenderness: There is abdominal tenderness (mild, LLQ).  Musculoskeletal:     Cervical back: Neck supple.  Skin:    General: Skin is warm.     Capillary Refill: Capillary refill takes less than 2 seconds.  Neurological:     Mental Status: She is alert and oriented to person, place, and time.     Motor: No abnormal muscle tone.     Comments: CNII-XII intact. Normal sensation to light touch in bl UE and LE. Strength 5/5 proximal and distal bl UE and LE.        ____________________________________________   LABS (all labs ordered are listed, but only abnormal results are displayed)  Labs Reviewed  BASIC METABOLIC PANEL - Abnormal; Notable for the following components:      Result Value   Glucose, Bld 119 (*)    BUN 29 (*)    GFR calc non  Af Amer 59 (*)    All other components within normal limits  CBC - Abnormal; Notable for the following components:   WBC 13.6 (*)    MCV 79.4 (*)    All other components within normal limits  HEPATIC FUNCTION PANEL  LIPASE, BLOOD  TROPONIN I (HIGH SENSITIVITY)  TROPONIN I (HIGH SENSITIVITY)    ____________________________________________  EKG: Sinus bradycardia, ventricular rate 45.  PR 156, QRS 82, QTc 394.  No acute ST elevations or depressions. ________________________________________  RADIOLOGY All imaging, including plain films, CT scans, and ultrasounds, independently  reviewed by me, and interpretations confirmed via formal radiology reads.  ED MD interpretation:   CT Head: Neg CT C-Spine: No acute fx or subluxation CT A/P: Constipation, no acute abnormality  Official radiology report(s): CT ABDOMEN PELVIS WO CONTRAST  Result Date: 08/04/2019 CLINICAL DATA:  Question of diverticulitis, abdominal pain EXAM: CT ABDOMEN AND PELVIS WITHOUT CONTRAST TECHNIQUE: Multidetector CT imaging of the abdomen and pelvis was performed following the standard protocol without IV contrast. COMPARISON:  May 10, 2015 FINDINGS: Lower chest: The visualized heart size within normal limits. No pericardial fluid/thickening. No hiatal hernia. The visualized portions of the lungs are clear. Hepatobiliary: Although limited due to the lack of intravenous contrast, normal in appearance without gross focal abnormality. No calcified gallstones. The gallbladder appears contracted. Pancreas:  Unremarkable.  No surrounding inflammatory changes. Spleen: Normal in size. Although limited due to the lack of intravenous contrast, normal in appearance. Adrenals/Urinary Tract: Both adrenal glands appear normal. There is a 7 mm calculus seen in the lower pole of the right kidney. There is also a 4 cm low-density lesion seen partially exophytic off the lower pole of the right kidney, likely renal cyst. No left-sided  renal calculi are noted. No ureteral or bladder calculi seen. No hydronephrosis. Stomach/Bowel: The stomach, small bowel, and colon are normal in appearance. No inflammatory changes or obstructive findings. Scattered colonic diverticula are noted without diverticulitis. There is a moderate amount right colonic stool present. Vascular/Lymphatic: There are no enlarged abdominal or pelvic lymph nodes. Scattered aortic atherosclerotic calcifications are seen without aneurysmal dilatation. Reproductive: The patient is status post hysterectomy. No adnexal masses or collections seen. Other: Adjacent to the right hepatic lobe there is a low-density cystic mass within the internal oblique and transversus abdominal musculature. This measures 6.5 x 2.2 cm and is grossly unchanged from the prior exam. A small fat containing umbilical hernia seen. Musculoskeletal: No acute or significant osseous findings. IMPRESSION: 1. Nonobstructing right renal calculus 2. Diverticula without diverticulitis 3. Moderate to large amount of colonic stool 4.  Aortic Atherosclerosis (ICD10-I70.0). 5. Unchanged cystic mass within the right abdominal wall musculature Electronically Signed   By: Prudencio Pair M.D.   On: 08/04/2019 19:29   CT Head Wo Contrast  Result Date: 08/04/2019 CLINICAL DATA:  68 year old female with syncope and head trauma. EXAM: CT HEAD WITHOUT CONTRAST CT CERVICAL SPINE WITHOUT CONTRAST TECHNIQUE: Multidetector CT imaging of the head and cervical spine was performed following the standard protocol without intravenous contrast. Multiplanar CT image reconstructions of the cervical spine were also generated. COMPARISON:  None. FINDINGS: CT HEAD FINDINGS Brain: The ventricles and sulci appropriate size for patient's age. Minimal periventricular and deep white matter chronic microvascular ischemic changes noted. There is no acute intracranial hemorrhage. No mass effect or midline shift. No extra-axial fluid collection.  Vascular: No hyperdense vessel or unexpected calcification. Skull: Normal. Negative for fracture or focal lesion. Sinuses/Orbits: No acute finding. Other: None CT CERVICAL SPINE FINDINGS Alignment: No acute subluxation. There is straightening of normal cervical lordosis which may be positional or due to muscle spasm. Skull base and vertebrae: No acute fracture. Soft tissues and spinal canal: No prevertebral fluid or swelling. No visible canal hematoma. Disc levels: Multilevel degenerative changes with endplate irregularity and disc space narrowing. Upper chest: Negative. Other: None IMPRESSION: 1. No acute intracranial hemorrhage. 2. No acute/traumatic cervical spine pathology. Electronically Signed   By: Anner Crete M.D.   On: 08/04/2019 17:24   CT Cervical Spine Wo Contrast  Result Date: 08/04/2019  CLINICAL DATA:  68 year old female with syncope and head trauma. EXAM: CT HEAD WITHOUT CONTRAST CT CERVICAL SPINE WITHOUT CONTRAST TECHNIQUE: Multidetector CT imaging of the head and cervical spine was performed following the standard protocol without intravenous contrast. Multiplanar CT image reconstructions of the cervical spine were also generated. COMPARISON:  None. FINDINGS: CT HEAD FINDINGS Brain: The ventricles and sulci appropriate size for patient's age. Minimal periventricular and deep white matter chronic microvascular ischemic changes noted. There is no acute intracranial hemorrhage. No mass effect or midline shift. No extra-axial fluid collection. Vascular: No hyperdense vessel or unexpected calcification. Skull: Normal. Negative for fracture or focal lesion. Sinuses/Orbits: No acute finding. Other: None CT CERVICAL SPINE FINDINGS Alignment: No acute subluxation. There is straightening of normal cervical lordosis which may be positional or due to muscle spasm. Skull base and vertebrae: No acute fracture. Soft tissues and spinal canal: No prevertebral fluid or swelling. No visible canal hematoma.  Disc levels: Multilevel degenerative changes with endplate irregularity and disc space narrowing. Upper chest: Negative. Other: None IMPRESSION: 1. No acute intracranial hemorrhage. 2. No acute/traumatic cervical spine pathology. Electronically Signed   By: Anner Crete M.D.   On: 08/04/2019 17:24    ____________________________________________  PROCEDURES   Procedure(s) performed (including Critical Care):  Procedures  ____________________________________________  INITIAL IMPRESSION / MDM / [place] / ED COURSE  As part of my medical decision making, I reviewed the following data within the [place] notes reviewed and incorporated, Old chart reviewed, Notes from prior ED visits, and [place] Controlled Substance Database       *Shakedra Igou was evaluated in Emergency Department on 08/04/2019 for the symptoms described in the history of present illness. She was evaluated in the context of the global COVID-19 pandemic, which necessitated consideration that the patient might be at risk for infection with the SARS-CoV-2 virus that causes COVID-19. Institutional protocols and algorithms that pertain to the evaluation of patients at risk for COVID-19 are in a state of rapid change based on information released by regulatory bodies including the CDC and federal and state organizations. These policies and algorithms were followed during the patient's care in the ED.  Some ED evaluations and interventions may be delayed as a result of limited staffing during the pandemic.*     Medical Decision Making: 68 year old female here with syncopal episode.  This occurs in the setting of abdominal pain, with prodrome consistent with vasovagal syncope, but she is also bradycardic here.  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  However, of note, she is on metoprolol so it is  possible she may not have ability to compensate for transient orthostasis, and given her baseline bradycardia I suspect she may need to be taken off this medication.  Otherwise, her lab work is reassuring.  She is anemic.  No murmur or signs of valvular disease or heart failure.  No focal neurological deficits.  She is amatory in ED without difficulty with normal blood pressure and is asymptomatic here.  Will advise her to start bowel regimen for constipation, as well as temporarily stop her metoprolol. She is supposed to finish her 7 day prednisone course in 2 days, so can stop this as well as brief 5 day course. She will call her PCP.    ____________________________________________  FINAL CLINICAL IMPRESSION(S) / ED DIAGNOSES  Final diagnoses:  Adverse effect of drug, initial encounter  Vasovagal  syncope  Bradycardia     MEDICATIONS GIVEN DURING THIS VISIT:  Medications - No data to display   ED Discharge Orders    None       Note:  This document was prepared using Dragon voice recognition software and may include unintentional dictation errors.   Duffy Bruce, MD 08/04/19 2233

## 2019-08-04 NOTE — ED Notes (Signed)
Pt eating crackers and drinking ale.

## 2019-10-27 ENCOUNTER — Other Ambulatory Visit
Admission: RE | Admit: 2019-10-27 | Discharge: 2019-10-27 | Disposition: A | Discharge status: Home / Self Care | Payer: Medicare Other | Source: Ambulatory Visit | Attending: Internal Medicine | Admitting: Internal Medicine

## 2019-10-27 DIAGNOSIS — Z20822 Contact with and (suspected) exposure to covid-19: Secondary | ICD-10-CM | POA: Insufficient documentation

## 2019-10-27 DIAGNOSIS — Z01812 Encounter for preprocedural laboratory examination: Secondary | ICD-10-CM | POA: Insufficient documentation

## 2019-10-27 LAB — SARS CORONAVIRUS 2 (TAT 6-24 HRS): SARS Coronavirus 2: NEGATIVE

## 2019-10-29 ENCOUNTER — Encounter
Admission: RE | Disposition: A | Discharge status: Home / Self Care | Payer: Self-pay | Source: Home / Self Care | Attending: Internal Medicine

## 2019-10-29 ENCOUNTER — Other Ambulatory Visit: Payer: Self-pay

## 2019-10-29 ENCOUNTER — Encounter: Payer: Self-pay | Admitting: Internal Medicine

## 2019-10-29 ENCOUNTER — Ambulatory Visit
Admission: RE | Admit: 2019-10-29 | Discharge: 2019-10-29 | Disposition: A | Discharge status: Home / Self Care | Payer: Medicare Other | Attending: Internal Medicine | Admitting: Internal Medicine

## 2019-10-29 ENCOUNTER — Ambulatory Visit: Payer: Medicare Other | Admitting: Certified Registered"

## 2019-10-29 DIAGNOSIS — G4730 Sleep apnea, unspecified: Secondary | ICD-10-CM | POA: Insufficient documentation

## 2019-10-29 DIAGNOSIS — K219 Gastro-esophageal reflux disease without esophagitis: Secondary | ICD-10-CM | POA: Insufficient documentation

## 2019-10-29 DIAGNOSIS — K449 Diaphragmatic hernia without obstruction or gangrene: Secondary | ICD-10-CM | POA: Insufficient documentation

## 2019-10-29 DIAGNOSIS — Z79899 Other long term (current) drug therapy: Secondary | ICD-10-CM | POA: Diagnosis not present

## 2019-10-29 DIAGNOSIS — K3189 Other diseases of stomach and duodenum: Secondary | ICD-10-CM | POA: Insufficient documentation

## 2019-10-29 DIAGNOSIS — R1013 Epigastric pain: Secondary | ICD-10-CM | POA: Insufficient documentation

## 2019-10-29 DIAGNOSIS — K5909 Other constipation: Secondary | ICD-10-CM | POA: Insufficient documentation

## 2019-10-29 DIAGNOSIS — M797 Fibromyalgia: Secondary | ICD-10-CM | POA: Insufficient documentation

## 2019-10-29 DIAGNOSIS — I10 Essential (primary) hypertension: Secondary | ICD-10-CM | POA: Diagnosis not present

## 2019-10-29 DIAGNOSIS — K319 Disease of stomach and duodenum, unspecified: Secondary | ICD-10-CM | POA: Insufficient documentation

## 2019-10-29 DIAGNOSIS — K2970 Gastritis, unspecified, without bleeding: Secondary | ICD-10-CM | POA: Insufficient documentation

## 2019-10-29 HISTORY — PX: ESOPHAGOGASTRODUODENOSCOPY (EGD) WITH PROPOFOL: SHX5813

## 2019-10-29 HISTORY — DX: Cardiac arrhythmia, unspecified: I49.9

## 2019-10-29 SURGERY — ESOPHAGOGASTRODUODENOSCOPY (EGD) WITH PROPOFOL
Anesthesia: General

## 2019-10-29 MED ORDER — SODIUM CHLORIDE 0.9 % IV SOLN
INTRAVENOUS | Status: DC
  Administered 2019-10-29: 1000 mL via INTRAVENOUS

## 2019-10-29 MED ORDER — LIDOCAINE HCL (CARDIAC) PF 100 MG/5ML IV SOSY
PREFILLED_SYRINGE | INTRAVENOUS | Status: DC | PRN
  Administered 2019-10-29: 100 mg via INTRATRACHEAL

## 2019-10-29 MED ORDER — GLYCOPYRROLATE 0.2 MG/ML IJ SOLN
INTRAMUSCULAR | Status: DC | PRN
  Administered 2019-10-29: .2 mg via INTRAVENOUS

## 2019-10-29 MED ORDER — PROPOFOL 10 MG/ML IV BOLUS
INTRAVENOUS | Status: DC | PRN
  Administered 2019-10-29: 50 mg via INTRAVENOUS
  Administered 2019-10-29: 10 mg via INTRAVENOUS

## 2019-10-29 MED ORDER — PROPOFOL 500 MG/50ML IV EMUL
INTRAVENOUS | Status: DC | PRN
  Administered 2019-10-29: 165 ug/kg/min via INTRAVENOUS

## 2019-10-29 NOTE — Op Note (Signed)
[facility] Gastroenterology Patient Name: Andrea Yang Procedure Date: 10/29/2019 11:07 AM MRN: [id number] Account #: [id number] Date of Birth: [birth year] Admit Type: Outpatient Age: 69 Room: [facility] ENDO ROOM 3 Gender: Female Note Status: Finalized Procedure:             Upper GI endoscopy Indications:           Dyspepsia, Esophageal reflux Providers:             Benay Pike. [name] MD, MD Referring MD:          Renee Rival (Referring MD) Medicines:             Propofol per Anesthesia Complications:         No immediate complications. Procedure:             Pre-Anesthesia Assessment:                        - The risks and benefits of the procedure and the                         sedation options and risks were discussed with the                         patient. All questions were answered and informed                         consent was obtained.                        - Patient identification and proposed procedure were                         verified prior to the procedure by the nurse. The                         procedure was verified in the procedure room.                        - ASA Grade Assessment: III - A patient with severe                         systemic disease.                        - After reviewing the risks and benefits, the patient                         was deemed in satisfactory condition to undergo the                         procedure.                        After obtaining informed consent, the endoscope was                         passed under direct vision. Throughout the procedure,                         the patient's blood pressure, pulse,  and oxygen                         saturations were monitored continuously. The Endoscope                         was introduced through the mouth, and advanced to the                         third part of duodenum. The upper GI endoscopy was                         accomplished without  difficulty. The patient tolerated                         the procedure well. Findings:      The esophagus was normal.      Patchy moderate inflammation characterized by erosions and erythema was       found in the gastric antrum. Biopsies were taken with a cold forceps for       Helicobacter pylori testing.      The cardia and gastric fundus were normal on retroflexion.      Localized mildly erythematous mucosa without active bleeding and with no       stigmata of bleeding was found in the second portion of the duodenum.      The first portion of the duodenum and third portion of the duodenum were       normal.      The exam was otherwise without abnormality. Impression:            - Normal esophagus.                        - Gastritis. Biopsied.                        - Erythematous duodenopathy.                        - Normal first portion of the duodenum and third                         portion of the duodenum.                        - The examination was otherwise normal. Recommendation:        - Patient has a contact number available for                         emergencies. The signs and symptoms of potential                         delayed complications were discussed with the patient.                         Return to normal activities tomorrow. Written                         discharge instructions were provided to the patient.                        -  Resume previous diet.                        - Continue present medications.                        - Await pathology results.                        - Return to nurse practitioner in 6 weeks.                        - Follow up with Stephens November, GI Nurse                         Practioner, in office to discuss results and monitor                         progress.                        - The findings and recommendations were discussed with                         the patient. Procedure Code(s):     --- Professional  ---                        [phone], Esophagogastroduodenoscopy, flexible,                         transoral; with biopsy, single or multiple Diagnosis Code(s):     --- Professional ---                        K21.9, Gastro-esophageal reflux disease without                         esophagitis                        R10.13, Epigastric pain                        K31.89, Other diseases of stomach and duodenum                        K29.70, Gastritis, unspecified, without bleeding CPT copyright 2019 American Medical Association. All rights reserved. The codes documented in this report are preliminary and upon coder review may  be revised to meet current compliance requirements. Efrain Sella MD, MD 10/29/2019 11:17:32 AM This report has been signed electronically. Number of Addenda: 0 Note Initiated On: 10/29/2019 11:07 AM Estimated Blood Loss:  Estimated blood loss: none.      [facility]

## 2019-10-29 NOTE — Transfer of Care (Signed)
Immediate Anesthesia Transfer of Care Note  Patient: Andrea Yang  Procedure(s) Performed: ESOPHAGOGASTRODUODENOSCOPY (EGD) WITH PROPOFOL (N/A )  Patient Location: Endoscopy Unit  Anesthesia Type:General  Level of Consciousness: drowsy and responds to stimulation  Airway & Oxygen Therapy: Patient Spontanous Breathing and Patient connected to face mask oxygen  Post-op Assessment: Report given to RN and Post -op Vital signs reviewed and stable  Post vital signs: Reviewed and stable  Last Vitals:  Vitals Value Taken Time  BP    Temp    Pulse 42 10/29/19 1120  Resp 16 10/29/19 1120  SpO2 100 % 10/29/19 1120  Vitals shown include unvalidated device data.  Last Pain:  Vitals:   10/29/19 1013  TempSrc: Temporal  PainSc: 0-No pain         Complications: No apparent anesthesia complications

## 2019-10-29 NOTE — Anesthesia Postprocedure Evaluation (Signed)
Anesthesia Post Note  Patient: Andrea Yang  Procedure(s) Performed: ESOPHAGOGASTRODUODENOSCOPY (EGD) WITH PROPOFOL (N/A )  Patient location during evaluation: Endoscopy Anesthesia Type: General Level of consciousness: awake and alert Pain management: pain level controlled Vital Signs Assessment: post-procedure vital signs reviewed and stable Respiratory status: spontaneous breathing, nonlabored ventilation, respiratory function stable and patient connected to nasal cannula oxygen Cardiovascular status: blood pressure returned to baseline and stable Postop Assessment: no apparent nausea or vomiting Anesthetic complications: no     Last Vitals:  Vitals:   10/29/19 1128 10/29/19 1138  BP: 134/69 (!) 147/65  Pulse: (!) 59 (!) 45  Resp: 16 (!) 9  Temp:    SpO2: 100% 100%    Last Pain:  Vitals:   10/29/19 1138  TempSrc:   PainSc: 0-No pain                 Martha Clan

## 2019-10-29 NOTE — Anesthesia Preprocedure Evaluation (Signed)
Anesthesia Evaluation  Patient identified by MRN, date of birth, ID band Patient awake    Reviewed: Allergy & Precautions, NPO status , Patient's Chart, lab work & pertinent test results  History of Anesthesia Complications Negative for: history of anesthetic complications  Airway Mallampati: II  TM Distance: >3 FB Neck ROM: Full    Dental  (+) Dental Advidsory Given, Missing   Pulmonary neg shortness of breath, sleep apnea and Continuous Positive Airway Pressure Ventilation , neg recent URI,    Pulmonary exam normal breath sounds clear to auscultation       Cardiovascular hypertension, Pt. on medications and On Home Beta Blockers (-) angina(-) Past MI and (-) Cardiac Stents Normal cardiovascular exam+ dysrhythmias (-) Valvular Problems/Murmurs     Neuro/Psych  Neuromuscular disease (fibromyalgia) negative psych ROS   GI/Hepatic Neg liver ROS, hiatal hernia, GERD  Medicated and Controlled,  Endo/Other  negative endocrine ROS  Renal/GU negative Renal ROS     Musculoskeletal  (+) Fibromyalgia -  Abdominal Normal abdominal exam  (+)   Peds  Hematology   Anesthesia Other Findings Past Medical History: No date: Chronic constipation No date: Dysrhythmia No date: Fatty liver disease, nonalcoholic No date: Fibromyalgia No date: GERD (gastroesophageal reflux disease) No date: History of hiatal hernia No date: Hypertension   Reproductive/Obstetrics                             Anesthesia Physical  Anesthesia Plan  ASA: II  Anesthesia Plan: General   Post-op Pain Management:    Induction: Intravenous  PONV Risk Score and Plan: 2 and Propofol infusion and TIVA  Airway Management Planned: Nasal Cannula and Natural Airway  Additional Equipment:   Intra-op Plan:   Post-operative Plan:   Informed Consent: I have reviewed the patients History and Physical, chart, labs and discussed the  procedure including the risks, benefits and alternatives for the proposed anesthesia with the patient or authorized representative who has indicated his/her understanding and acceptance.     Dental advisory given  Plan Discussed with: CRNA and Surgeon  Anesthesia Plan Comments:         Anesthesia Quick Evaluation

## 2019-10-29 NOTE — Interval H&P Note (Signed)
History and Physical Interval Note:  10/29/2019 10:58 AM  Andrea Yang  has presented today for surgery, with the diagnosis of DYSPEPSIA.  The various methods of treatment have been discussed with the patient and family. After consideration of risks, benefits and other options for treatment, the patient has consented to  Procedure(s): ESOPHAGOGASTRODUODENOSCOPY (EGD) WITH PROPOFOL (N/A) as a surgical intervention.  The patient's history has been reviewed, patient examined, no change in status, stable for surgery.  I have reviewed the patient's chart and labs.  Questions were answered to the patient's satisfaction.     [place], [place]

## 2019-10-29 NOTE — H&P (Signed)
Outpatient short stay form Pre-procedure 10/29/2019 10:56 AM Andrea Yang K. [name], M.D.  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  No dysphagia, hemetemesis.     Current Facility-Administered Medications:  .  0.9 %  sodium chloride infusion, , Intravenous, Continuous, [place], Benay Pike, MD, Last Rate: 20 mL/hr at 10/29/19 1035, 1,000 mL at 10/29/19 1035  Medications Prior to Admission  Medication Sig Dispense Refill Last Dose  . amLODipine (NORVASC) 2.5 MG tablet Take 2.5 mg by mouth daily.   10/29/2019 at [employer]  . calcium carbonate (OSCAL) 1500 (600 Ca) MG TABS tablet Take by mouth 2 (two) times daily with a meal.   Past Week at Unknown time  . cetirizine (ZYRTEC) 10 MG tablet Take 10 mg by mouth daily.   10/28/2019 at Unknown time  . cholecalciferol (VITAMIN D) 400 units TABS tablet Take 400 Units by mouth.   Past Week at Unknown time  . diphenhydrAMINE (BENADRYL) 50 MG tablet Take 50 mg by mouth at bedtime as needed for itching.   Past Week at Unknown time  . EVENING PRIMROSE OIL PO Take by mouth daily at 6 (six) AM.   Past Week at Unknown time  . famotidine (PEPCID) 10 MG tablet Take 10 mg by mouth 2 (two) times daily.   Past Week at Unknown time  . fluconazole (DIFLUCAN) 150 MG tablet Take 150 mg by mouth daily. Reported on 09/27/2015   Past Week at Unknown time  . fluticasone (FLONASE) 50 MCG/ACT nasal spray Place 2 sprays into both nostrils daily.   Past Week at Unknown time  . gabapentin (NEURONTIN) 300 MG capsule Take 1 capsule (300 mg total) by mouth 2 (two) times daily AND 2 capsules (600 mg total) at bedtime. 120 capsule 5 10/28/2019 at Unknown time  . Magnesium Oxide 500 MG (LAX) TABS Take 500 mg by mouth at bedtime.   Past Week at Unknown time  .  Melatonin 10 MG CAPS Take 20 mg by mouth at bedtime as needed. 60 capsule 11 10/28/2019 at Unknown time  . metoprolol tartrate (LOPRESSOR) 25 MG tablet Take 25 mg by mouth 2 (two) times daily.   10/29/2019 at [place]  . Multiple Vitamin (MULTIVITAMIN) tablet Take 1 tablet by mouth daily.   Past Week at Unknown time  . polyethylene glycol (MIRALAX / GLYCOLAX) packet Take 1 packet by mouth as needed.   10/28/2019 at Unknown time  . sucralfate (CARAFATE) 1 g tablet Take 1 g by mouth daily.   10/28/2019 at Unknown time  . albuterol (PROVENTIL HFA;VENTOLIN HFA) 108 (90 Base) MCG/ACT inhaler Inhale 2 puffs into the lungs every 6 (six) hours as needed for wheezing or shortness of breath.    at PRN     Allergies  Allergen Reactions  . Dicyclomine   . Minoxidil Other (See Comments)  . Triamcinolone Acetonide Palpitations  . Valacyclovir Nausea Only  . Amantadines Other (See Comments)    Stomach pain.   Marland Kitchen Cymbalta [Duloxetine Hcl] Other (See Comments)    Stomach Pain  . Erythromycin Other (See Comments)    Stomach Ache  . Flexeril [Cyclobenzaprine] Other (See Comments)  . Hydrochlorothiazide     Leg Cramps  . Iodine Hives    Welts and hives.   . Meloxicam     Chest  pain, burning in stomach  . Nexium [Esomeprazole Magnesium] Swelling  . Prednisone Other (See Comments)    LOSS OF CONCIOUSNESS   . Prilosec [Omeprazole] Swelling    Tongue Swelling   . Savella [Milnacipran Hcl]     Stomach Pain  . Cleocin [Clindamycin Hcl] Rash  . Cleocin [Clindamycin] Rash  . Fluocinolone Acetonide Other (See Comments)  . Latex Rash  . Tramadol Rash    Rash and headache.      Past Medical History:  Diagnosis Date  . Chronic constipation   . Dysrhythmia   . Fatty liver disease, nonalcoholic   . Fibromyalgia   . GERD (gastroesophageal reflux disease)   . History of hiatal hernia   . Hypertension     Review of systems:  Otherwise negative.    Physical Exam  Gen: Alert, oriented. Appears stated  age.  HEENT: [place]/AT. PERRLA. Lungs: CTA, no wheezes. CV: RR nl S1, S2. Abd: soft, benign, no masses. BS+ Ext: No edema. Pulses 2+    Planned procedures: Proceed with colonoscopy. The patient understands the nature of the planned procedure, indications, risks, alternatives and potential complications including but not limited to bleeding, infection, perforation, damage to internal organs and possible oversedation/side effects from anesthesia. The patient agrees and gives consent to proceed.  Please refer to procedure notes for findings, recommendations and patient disposition/instructions.     Chrishonda Hesch K. [name], M.D. Gastroenterology 10/29/2019  10:56 AM

## 2019-10-30 ENCOUNTER — Encounter: Payer: Self-pay | Admitting: *Deleted

## 2019-10-30 LAB — SURGICAL PATHOLOGY

## 2019-10-30 NOTE — OR Nursing (Signed)
Lip swollen Dr [name] aware, instructed to call Dr [name] if further problems.

## 2019-11-17 IMAGING — CR DG LUMBAR SPINE COMPLETE W/ BEND
1 series · 7 of 7 positions shown · non-contrast
Comparison: None.

CLINICAL DATA: Chronic lumbago

EXAM:
LUMBAR SPINE - COMPLETE WITH BENDING VIEWS

[Series 1: dg lumbar spine complete w/bend 6+v · 0.14mm/px · 7 of 7 slices shown]
[im 1/7]
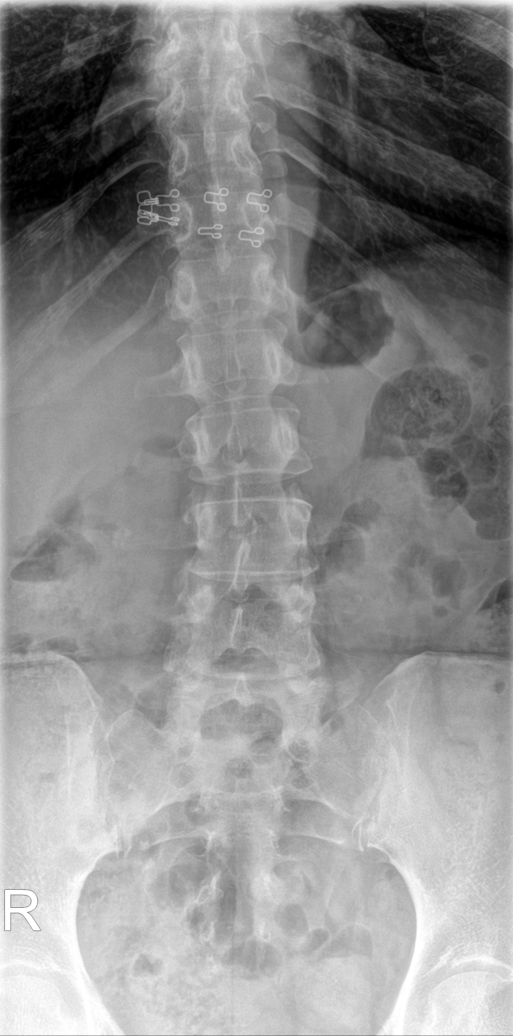
[im 2/7]
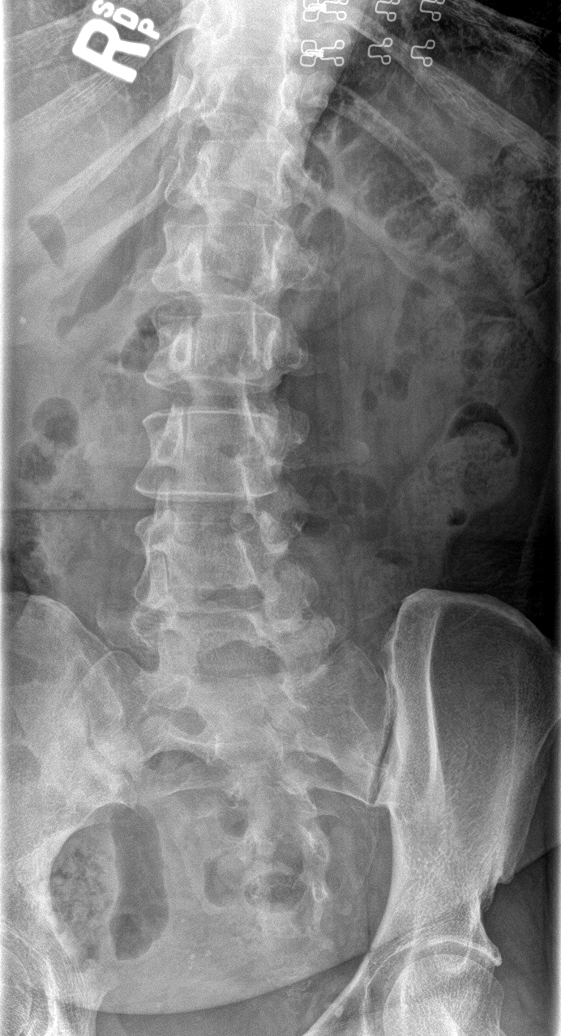
[im 3/7]
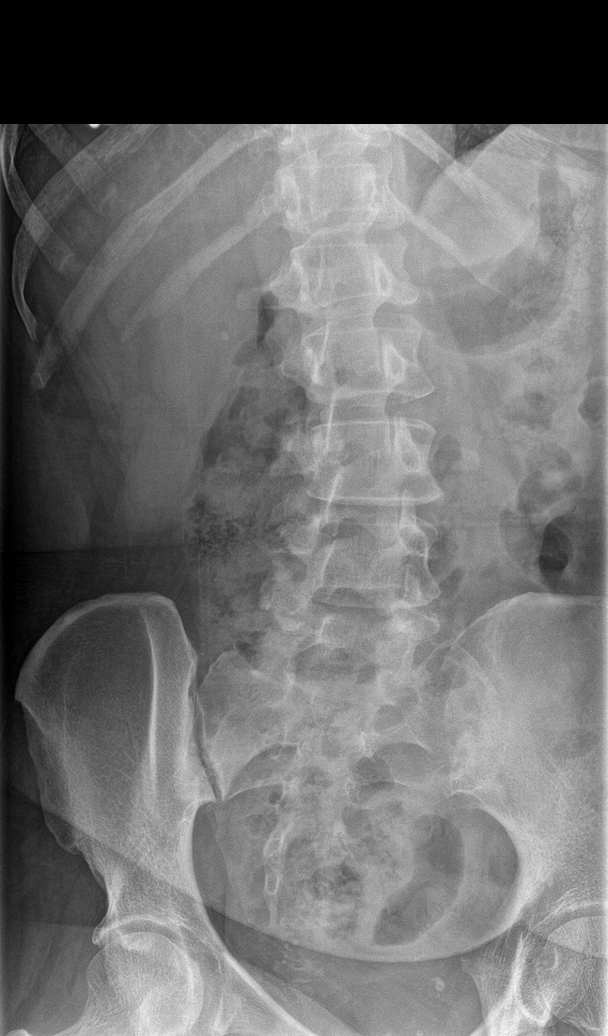
[im 4/7]
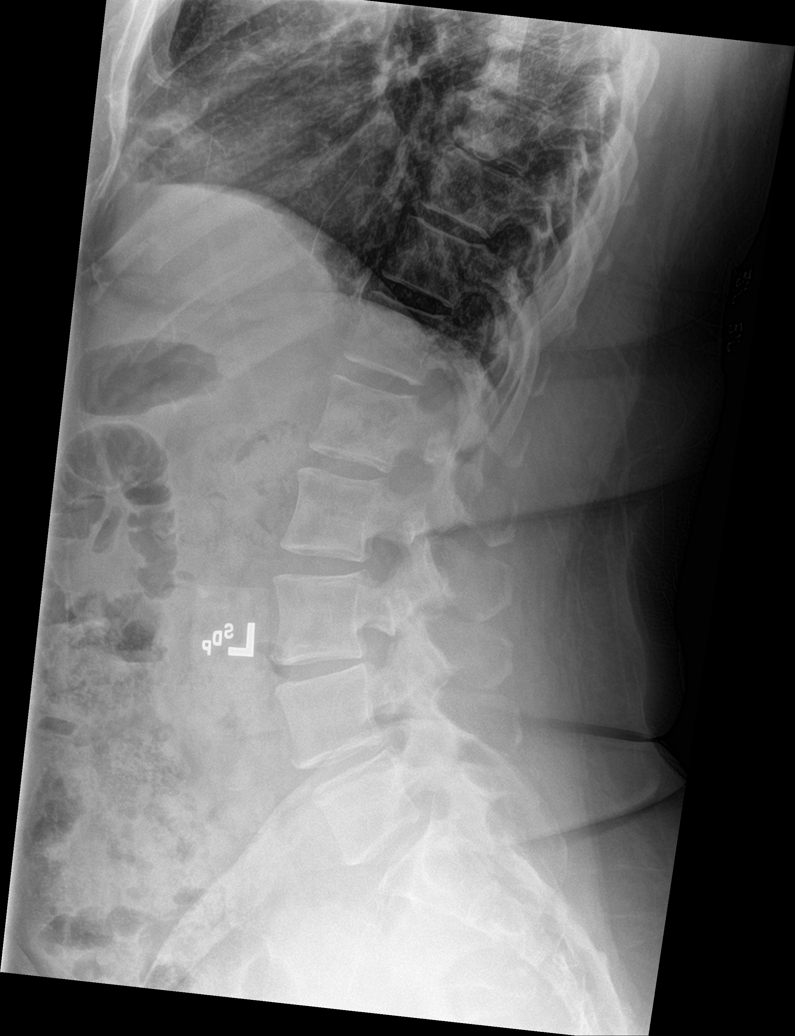
[im 5/7]
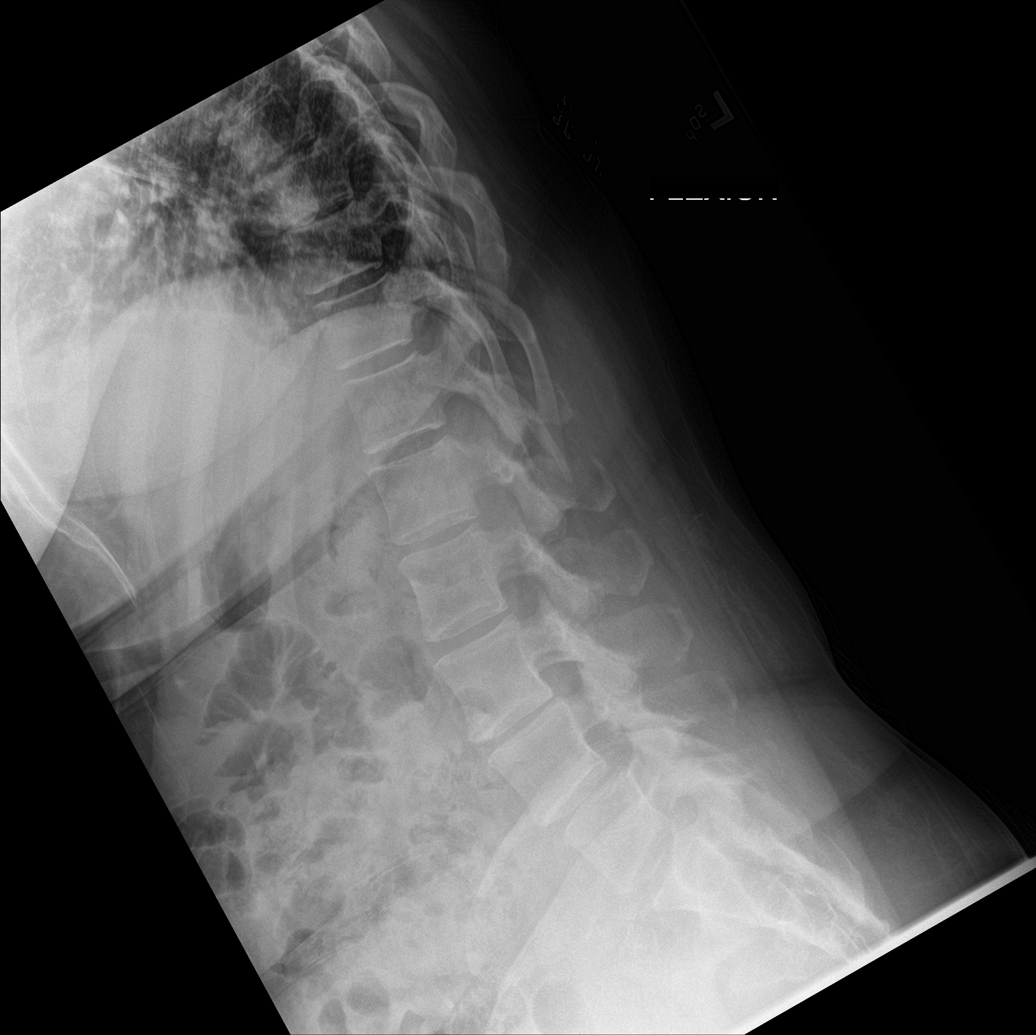
[im 6/7]
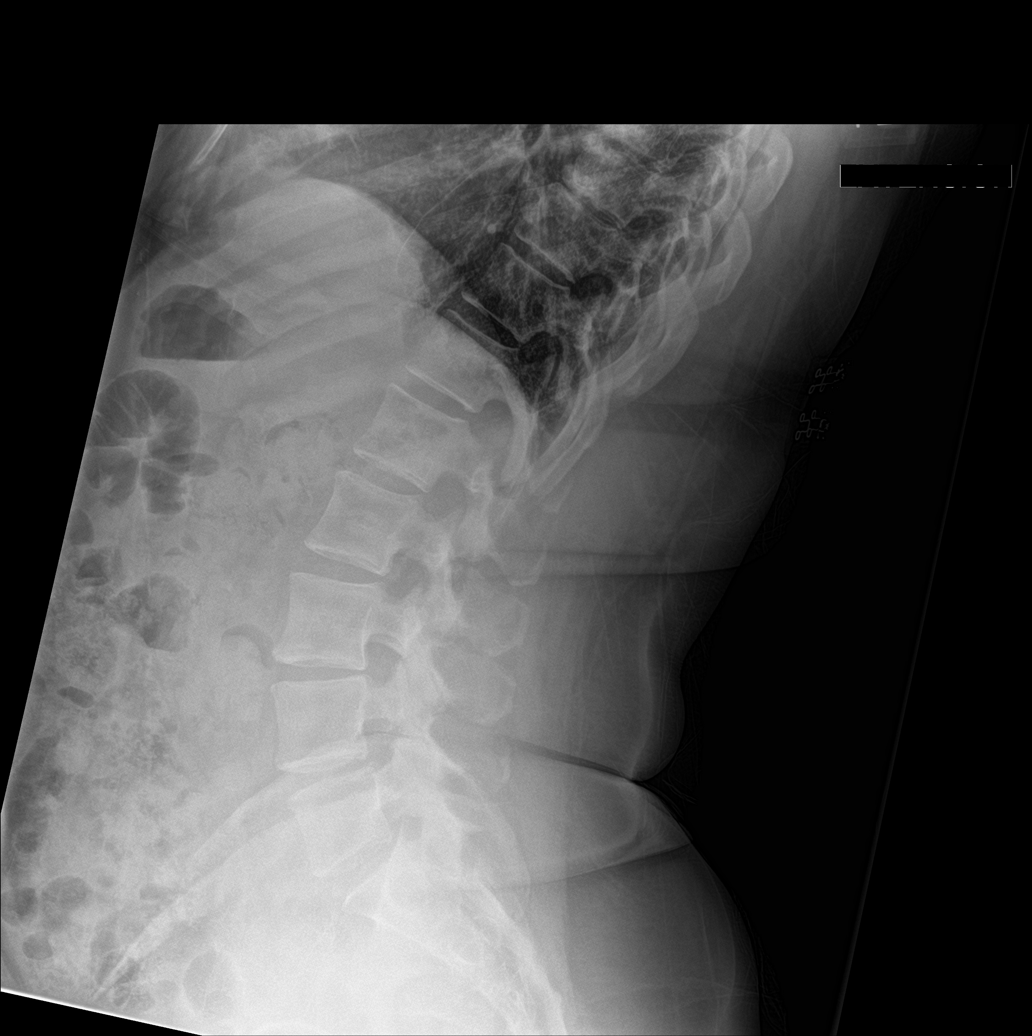
[im 7/7]
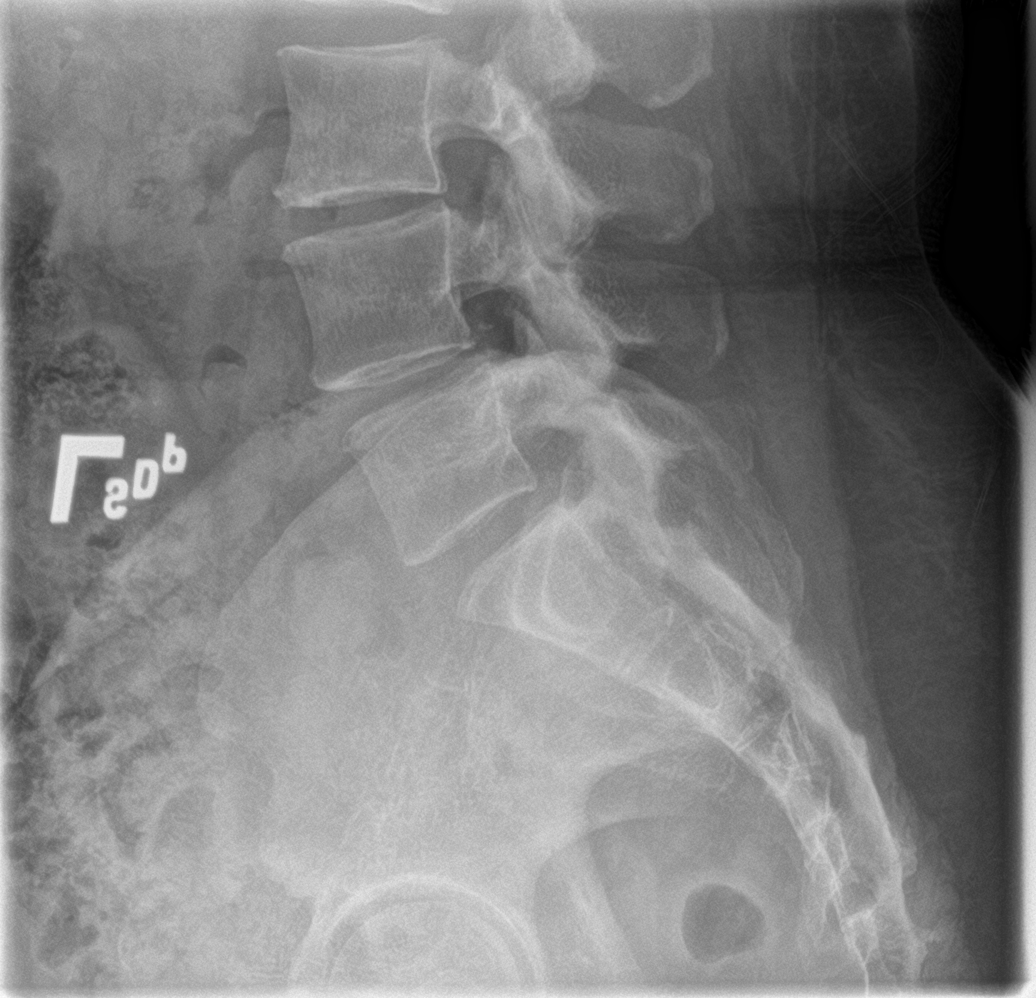

[7 of 7 positions shown; findings below may reference images not displayed]

FINDINGS: Frontal, neutral lateral, flexion lateral, extension lateral, spot
lumbosacral lateral, and bilateral oblique-total 7-views obtained.
There are 5 non-rib-bearing lumbar type vertebral bodies. There is
slight lumbar levoscoliosis. No fracture is evident.

On neutral lateral and extension lateral imaging, there is no
appreciable spondylolisthesis. With flexion, there is 4 mm of
anterolisthesis of L4 on L5. No other spondylolisthesis seen with
flexion. There is mild disc space narrowing at L3-4 and L4-5. Other
disc spaces appear unremarkable. There is facet osteoarthritic
change at L4-5 and L5-S1 bilaterally.
IMPRESSION: With flexion, there is 4 mm of anterolisthesis of L4 on L5. No
spondylolisthesis at L4-5 with neutral lateral and extension lateral
imaging. No spondylolisthesis elsewhere. Mild disc space narrowing
noted at L3-4 and L4-5. Mild levoscoliosis. No fracture or
spondylolisthesis.

## 2019-11-17 IMAGING — CR DG SI JOINTS 3+V
1 series · 3 of 3 positions shown · non-contrast
Comparison: None.

CLINICAL DATA: Chronic pain

EXAM:
BILATERAL SACROILIAC JOINTS - 3+ VIEW

[Series 1: dg si joints · 0.14mm/px · 3 of 3 slices shown]
[im 1/3]
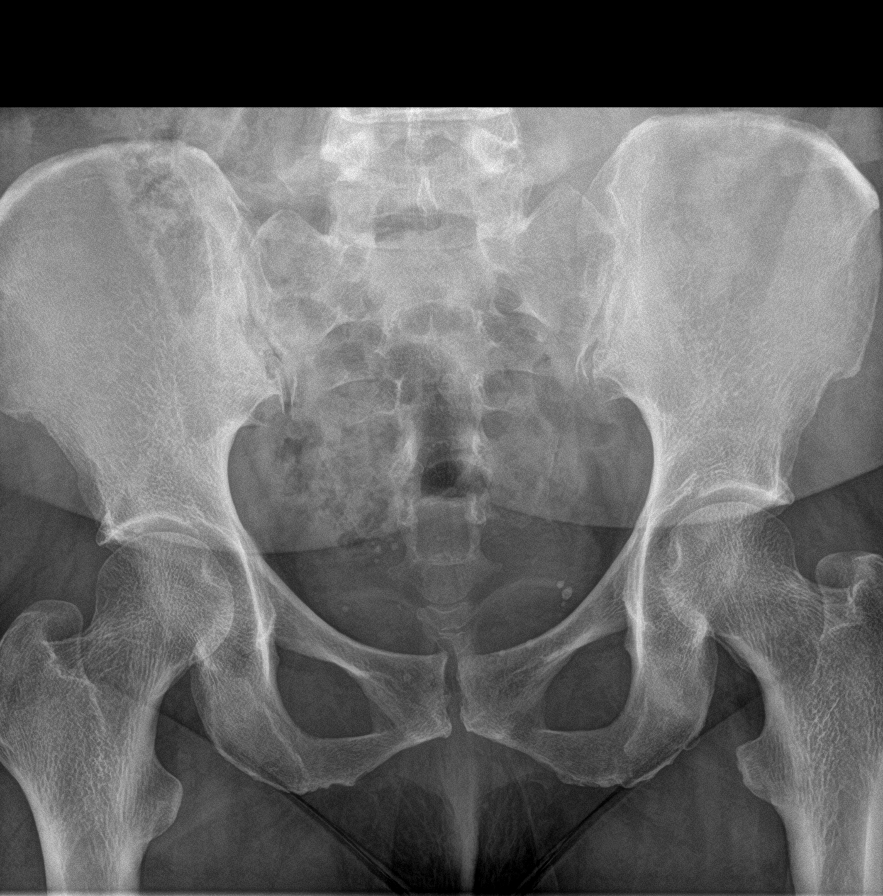
[im 2/3]
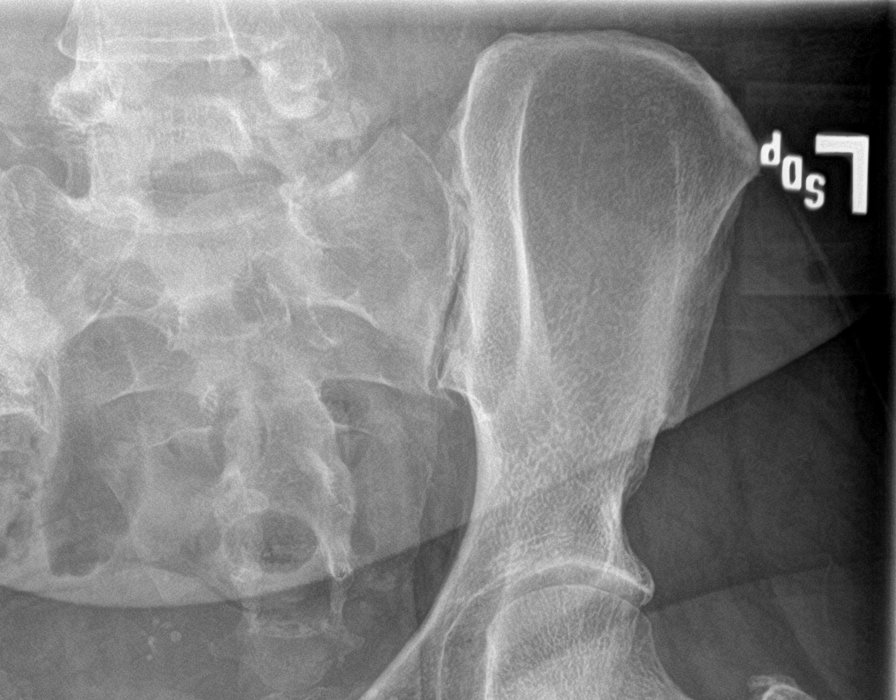
[im 3/3]
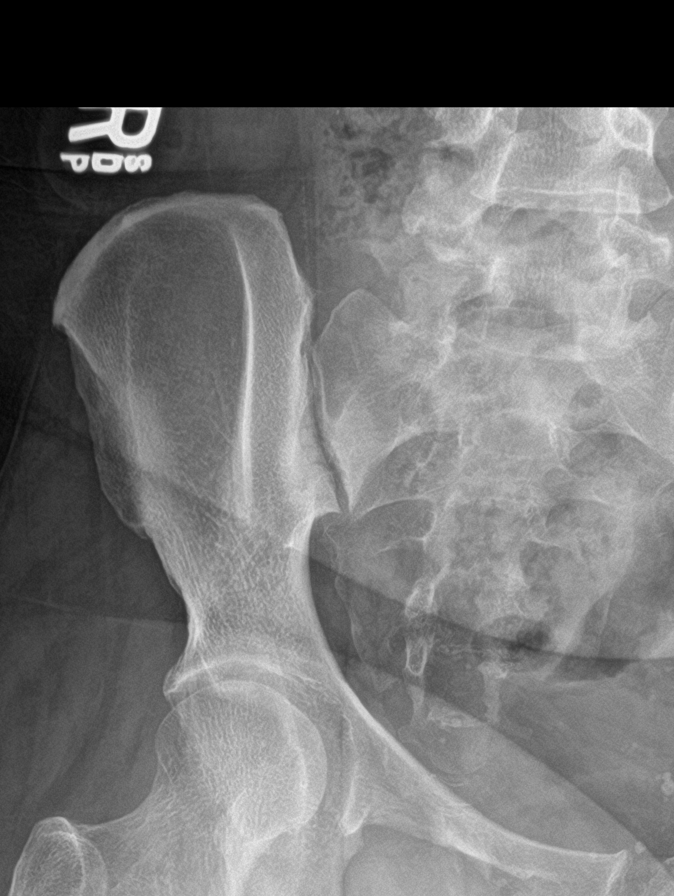

[3 of 3 positions shown; findings below may reference images not displayed]

FINDINGS: Frontal and bilateral oblique views were obtained. There is no
evident fracture or diastases. Sacroiliac joints appear symmetric
bilaterally. No arthropathy or erosion noted. Hip joints appear
normal.
IMPRESSION: No evident arthropathy.  No fracture or diastasis.

## 2019-11-17 IMAGING — CR DG CERVICAL SPINE WITH FLEX & EXTEND
1 series · 8 of 8 positions shown · non-contrast
Comparison: None.

CLINICAL DATA: Chronic cervicalgia

EXAM:
CERVICAL SPINE COMPLETE WITH FLEXION AND EXTENSION VIEWS

[Series 1: dg cervical spine with flex & extend · 0.14mm/px · 8 of 8 slices shown]
[im 1/8]
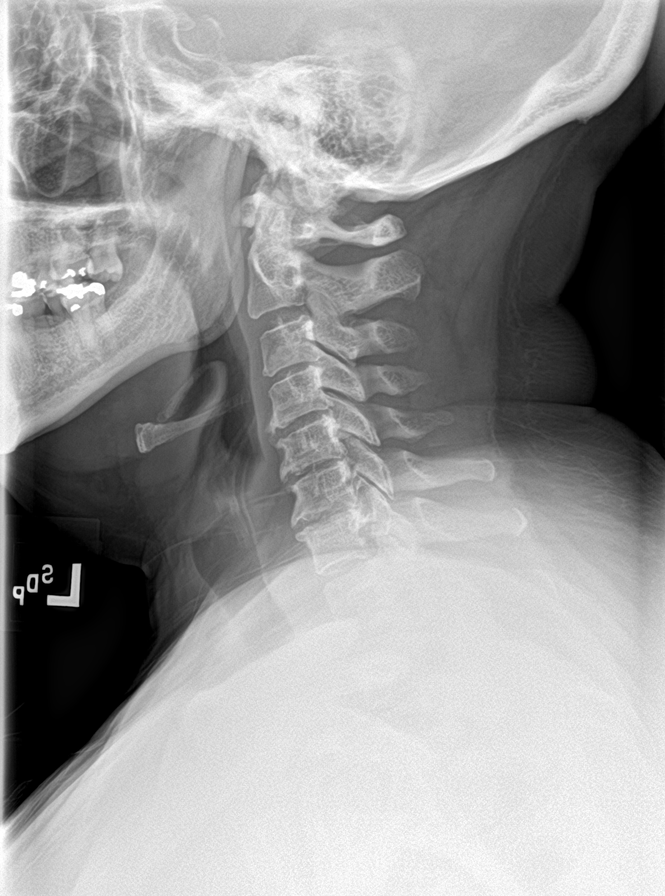
[im 2/8]
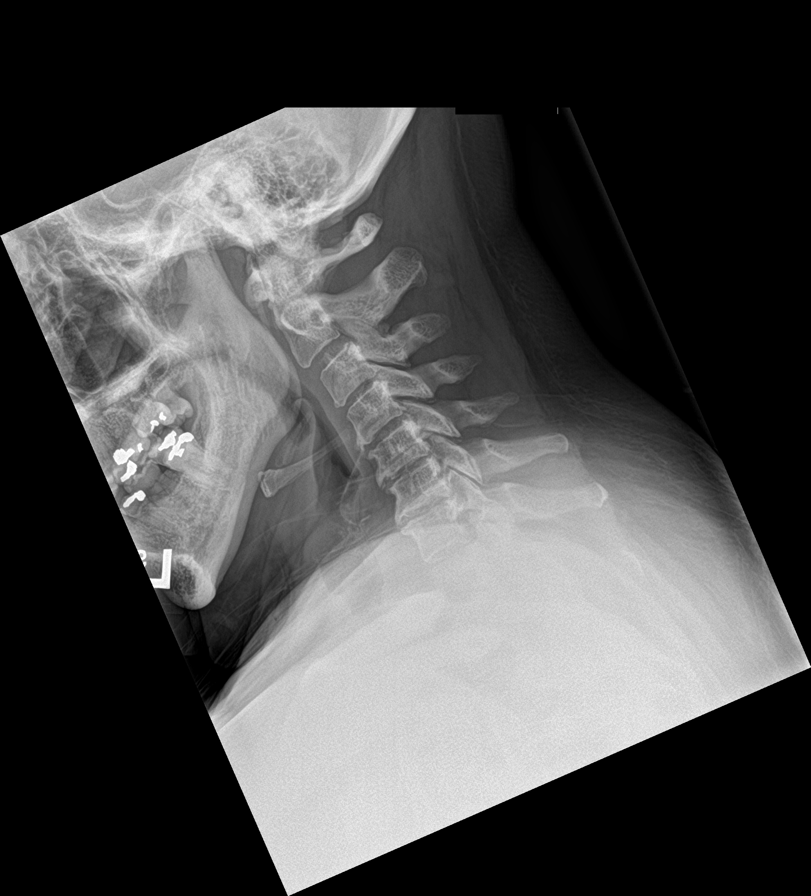
[im 3/8]
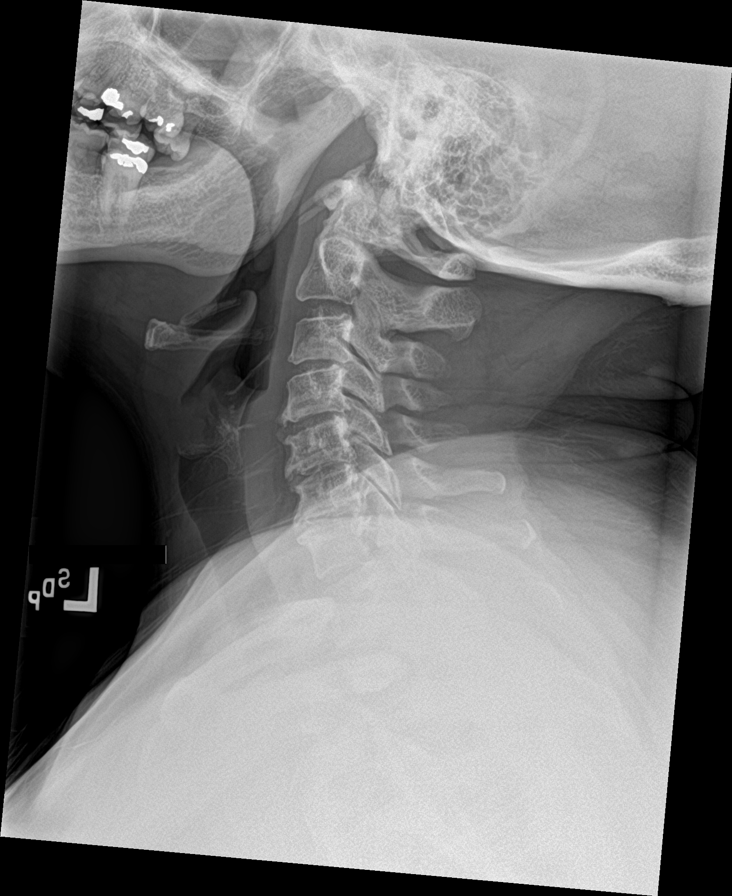
[im 4/8]
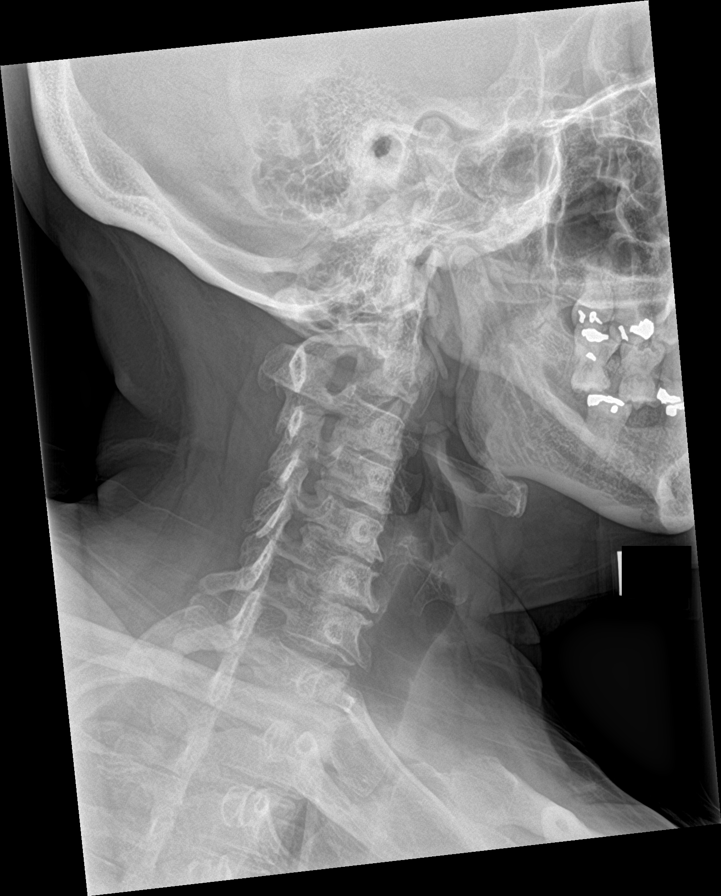
[im 5/8]
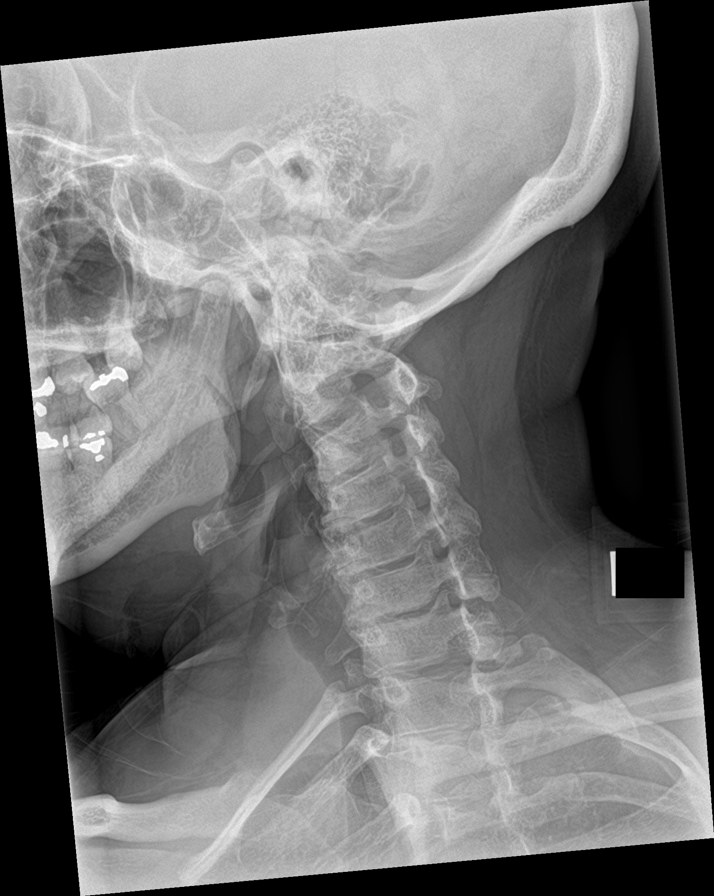
[im 6/8]
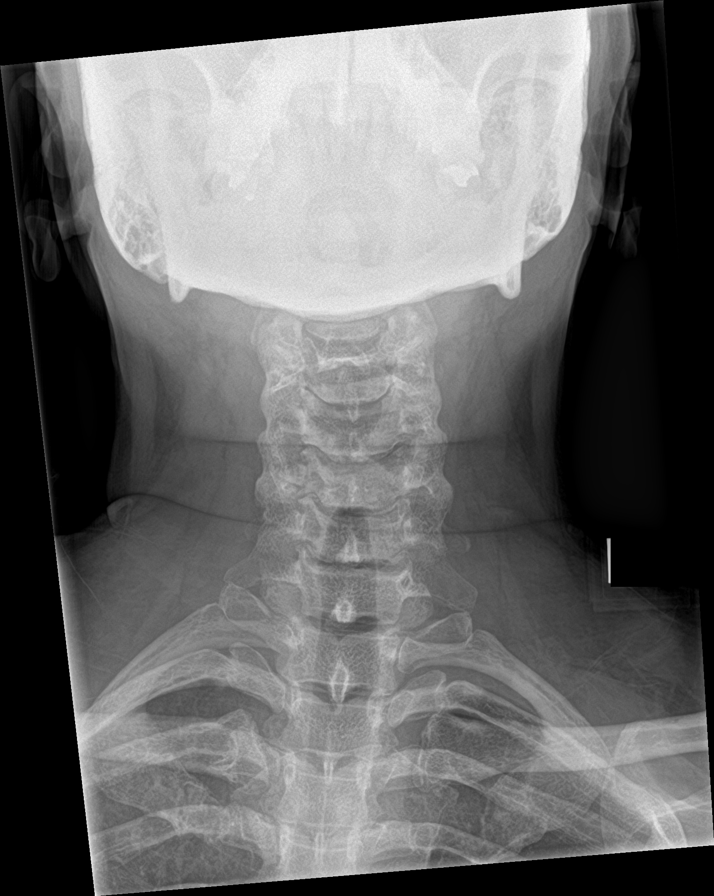
[im 7/8]
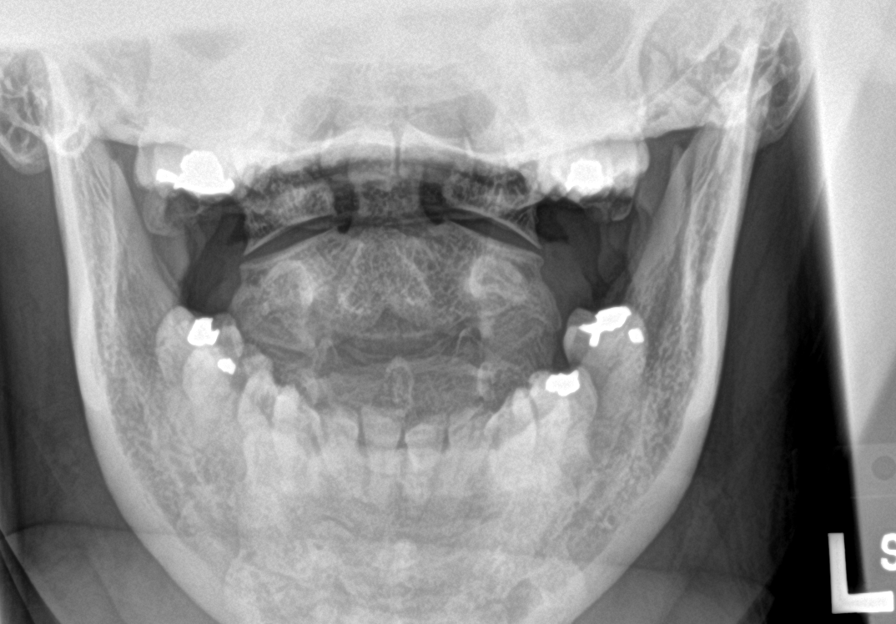
[im 8/8]
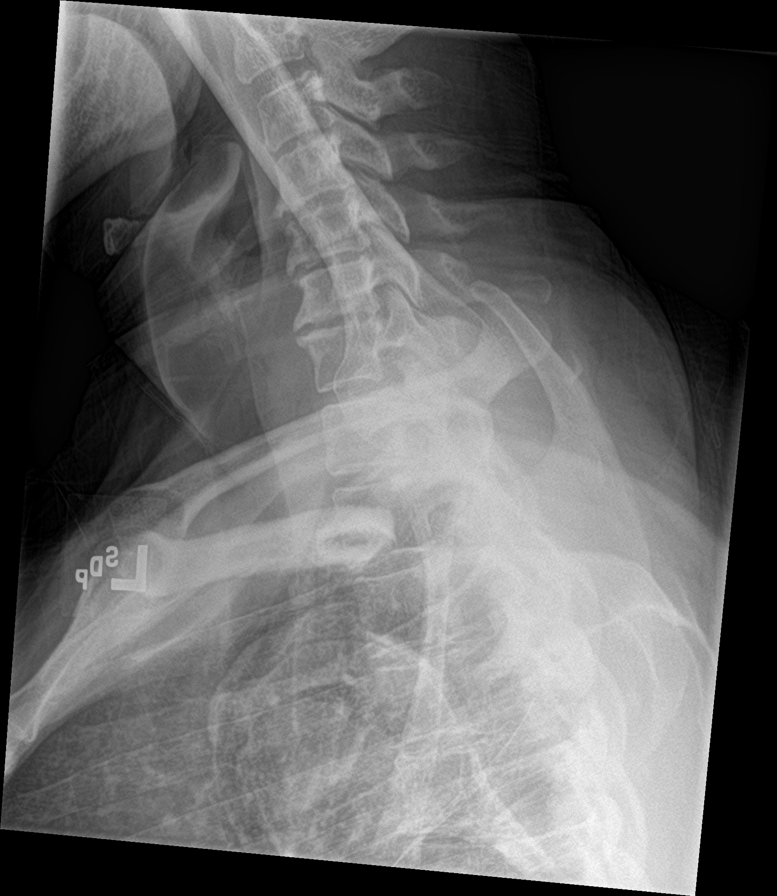

[8 of 8 positions shown; findings below may reference images not displayed]

FINDINGS: Frontal, neutral lateral, flexion lateral, extension lateral,
open-mouth odontoid, and bilateral oblique-total 7-views obtained.
There is no evident fracture. There is no spondylolisthesis on
neutral lateral imaging. There is no change in lateral alignment
with flexion and extension imaging. Prevertebral soft tissues and
predental space regions are normal.

This C3-4, C4-5, and C7-T1. There is somewhat more severe disc space
narrowing at C4-5 and C5-6. There are anterior osteophytes at C4,
C5, C6, and C7. There is exit foraminal narrowing due to bony
hypertrophy at C2-3 on the left, at C3-4 bilaterally, at C4-5
bilaterally, at C5-6 bilaterally, and at C6-7 bilaterally. No
erosive changes. Lung apices are clear.
IMPRESSION: Multilevel arthropathy.  No fracture or spondylolisthesis.

## 2020-01-25 NOTE — Progress Notes (Signed)
Patient: Andrea Yang  Service Category: E/M  Provider: Gaspar Cola, MD  DOB: [birth year]  DOS: 01/26/2020  Location: Office  MRN: [id number]  Setting: Ambulatory outpatient  Referring Provider: Renee Rival, NP  Type: Established Patient  Specialty: Interventional Pain Management  PCP: Renee Rival, NP  Location: Remote location  Delivery: TeleHealth     Virtual Encounter - Pain Management PROVIDER NOTE: Information contained herein reflects review and annotations entered in association with encounter. Interpretation of such information and data should be left to medically-trained personnel. Information provided to patient can be located elsewhere in the medical record under "Patient Instructions". Document created using STT-dictation technology, any transcriptional errors that may result from process are unintentional.    Contact & Pharmacy Preferred: [phone] Home: [phone] (home) Mobile: [phone] (mobile) E-mail: [email or web address], [place] [address] [place] [address] Phone: [phone] Fax: [phone]   Pre-screening  Andrea Yang offered "in-person" vs "virtual" encounter. She indicated preferring virtual for this encounter.   Reason COVID-19*  Social distancing based on CDC and AMA recommendations.   I contacted Andrea Yang on 01/26/2020 via telephone.      I clearly identified myself as Gaspar Cola, MD. I verified that I was speaking with the correct person using two identifiers (Name: Andrea Yang, and date of birth: 1950/11/25).  Consent I sought verbal advanced consent from Andrea Yang for virtual visit interactions. I informed Andrea Yang of possible security and privacy concerns, risks, and limitations associated with providing "not-in-person" medical evaluation and management services. I also informed Andrea Yang of the availability of "in-person" appointments. Finally, I  informed her that there would be a charge for the virtual visit and that she could be  personally, fully or partially, financially responsible for it. Ms. Crothers expressed understanding and agreed to proceed.   Historic Elements   Andrea Yang is a 69 y.o. year old, female patient evaluated today after her last contact with our practice on Visit date not found. Andrea Yang  has a past medical history of Chronic constipation, Dysrhythmia, Fatty liver disease, nonalcoholic, Fibromyalgia, GERD (gastroesophageal reflux disease), History of hiatal hernia, and Hypertension. She also  has a past surgical history that includes Colonoscopy with propofol (N/A, 09/27/2015); Abdominal hysterectomy; Breast surgery; and Esophagogastroduodenoscopy (egd) with propofol (N/A, 10/29/2019). Andrea Yang has a current medication list which includes the following prescription(s): albuterol, amlodipine, calcium carbonate, cetirizine, cholecalciferol, diphenhydramine, evening primrose oil, famotidine, fluticasone, gabapentin, magnesium oxide, melatonin, multivitamin, nebivolol, polyethylene glycol, probiotic product, and sucralfate. She  reports that she has never smoked. She has never used smokeless tobacco. She reports that she does not drink alcohol and does not use drugs. Andrea Yang is allergic to dicyclomine, minoxidil, triamcinolone acetonide, valacyclovir, amantadines, cymbalta [duloxetine hcl], erythromycin, flexeril [cyclobenzaprine], hydrochlorothiazide, iodine, meloxicam, nexium [esomeprazole magnesium], prednisone, prilosec [omeprazole], savella [milnacipran hcl], cleocin [clindamycin hcl], cleocin [clindamycin], fluocinolone acetonide, latex, and tramadol.   HPI  Today, she is being contacted for medication management. The patient indicates doing well with the current medication regimen. No adverse reactions or side effects reported to the medications.   Pharmacotherapy Assessment  Analgesic: Not using opioid  analgesics.   Monitoring: [place] PMP: PDMP reviewed during this encounter.       Pharmacotherapy: No side-effects or adverse reactions reported. Compliance: No problems identified. Effectiveness: Clinically acceptable. Plan: Refer to "POC".  UDS:  Summary  Date Value Ref Range Status  12/10/2017 FINAL  Final  Comment:    ==================================================================== [facility] COMP DRUG ANALYSIS,UR ==================================================================== Test                             Result       Flag       Units Drug Present and Declared for Prescription Verification   Gabapentin                     PRESENT      EXPECTED Drug Present not Declared for Prescription Verification   Metoprolol                     PRESENT      UNEXPECTED Drug Absent but Declared for Prescription Verification   Cyclobenzaprine                Not Detected UNEXPECTED   Diphenhydramine                Not Detected UNEXPECTED ==================================================================== Test                      Result    Flag   Units      Ref Range   Creatinine              118              mg/dL      >=20 ==================================================================== Declared Medications:  The flagging and interpretation on this report are based on the  following declared medications.  Unexpected results may arise from  inaccuracies in the declared medications.  **Note: The testing scope of this panel includes these medications:  Cyclobenzaprine  Diphenhydramine  Gabapentin  **Note: The testing scope of this panel does not include following  reported medications:  Albuterol  Amlodipine  Calcium carbonate  Cetirizine  Cholecalciferol  Fluconazole  Fluticasone  Multivitamin  Polyethylene Glycol  Sucralfate  Supplement (Primrose) ==================================================================== For clinical consultation, please call [phone]. ====================================================================     Laboratory Chemistry Profile   Renal Lab Results  Component Value Date   BUN 29 (H) 08/04/2019   CREATININE 0.98 08/04/2019   BCR 15 12/11/2017   GFRAA >60 08/04/2019   GFRNONAA 59 (L) 08/04/2019     Hepatic Lab Results  Component Value Date   AST 22 08/04/2019   ALT 16 08/04/2019   ALBUMIN 3.9 08/04/2019   ALKPHOS 66 08/04/2019   LIPASE 31 08/04/2019     Electrolytes Lab Results  Component Value Date   NA 137 08/04/2019   K 3.9 08/04/2019   CL 103 08/04/2019   CALCIUM 9.2 08/04/2019   MG 2.1 12/11/2017     Bone Lab Results  Component Value Date   25OHVITD1 42 12/11/2017   25OHVITD2 1.5 12/11/2017   25OHVITD3 40 12/11/2017     Inflammation (CRP: Acute Phase) (ESR: Chronic Phase) Lab Results  Component Value Date   CRP 7.6 (H) 12/11/2017   ESRSEDRATE 39 12/11/2017       Note: Above Lab results reviewed.   Imaging  CT ABDOMEN PELVIS WO CONTRAST CLINICAL DATA:  Question of diverticulitis, abdominal pain  EXAM: CT ABDOMEN AND PELVIS WITHOUT CONTRAST  TECHNIQUE: Multidetector CT imaging of the abdomen and pelvis was performed following the standard protocol without IV contrast.  COMPARISON:  May 10, 2015  FINDINGS: Lower chest: The visualized heart size within normal limits. No pericardial fluid/thickening.  No hiatal hernia.  The  visualized portions of the lungs are clear.  Hepatobiliary: Although limited due to the lack of intravenous contrast, normal in appearance without gross focal abnormality. No calcified gallstones. The gallbladder appears contracted.  Pancreas:  Unremarkable.  No surrounding inflammatory changes.  Spleen: Normal in size. Although limited due to the lack of intravenous contrast, normal in appearance.  Adrenals/Urinary Tract: Both adrenal glands appear normal. There is a 7 mm calculus seen in the lower pole of the right kidney.  There is also a 4 cm low-density lesion seen partially exophytic off the lower pole of the right kidney, likely renal cyst. No left-sided renal calculi are noted. No ureteral or bladder calculi seen. No hydronephrosis.  Stomach/Bowel: The stomach, small bowel, and colon are normal in appearance. No inflammatory changes or obstructive findings. Scattered colonic diverticula are noted without diverticulitis. There is a moderate amount right colonic stool present.  Vascular/Lymphatic: There are no enlarged abdominal or pelvic lymph nodes. Scattered aortic atherosclerotic calcifications are seen without aneurysmal dilatation.  Reproductive: The patient is status post hysterectomy. No adnexal masses or collections seen.  Other: Adjacent to the right hepatic lobe there is a low-density cystic mass within the internal oblique and transversus abdominal musculature. This measures 6.5 x 2.2 cm and is grossly unchanged from the prior exam. A small fat containing umbilical hernia seen.  Musculoskeletal: No acute or significant osseous findings.  IMPRESSION: 1. Nonobstructing right renal calculus 2. Diverticula without diverticulitis 3. Moderate to large amount of colonic stool 4.  Aortic Atherosclerosis (ICD10-I70.0). 5. Unchanged cystic mass within the right abdominal wall musculature  Electronically Signed   By: Prudencio Pair M.D.   On: 08/04/2019 19:29 CT Cervical Spine Wo Contrast CLINICAL DATA:  69 year old female with syncope and head trauma.  EXAM: CT HEAD WITHOUT CONTRAST  CT CERVICAL SPINE WITHOUT CONTRAST  TECHNIQUE: Multidetector CT imaging of the head and cervical spine was performed following the standard protocol without intravenous contrast. Multiplanar CT image reconstructions of the cervical spine were also generated.  COMPARISON:  None.  FINDINGS: CT HEAD FINDINGS  Brain: The ventricles and sulci appropriate size for patient's age. Minimal periventricular  and deep white matter chronic microvascular ischemic changes noted. There is no acute intracranial hemorrhage. No mass effect or midline shift. No extra-axial fluid collection.  Vascular: No hyperdense vessel or unexpected calcification.  Skull: Normal. Negative for fracture or focal lesion.  Sinuses/Orbits: No acute finding.  Other: None  CT CERVICAL SPINE FINDINGS  Alignment: No acute subluxation. There is straightening of normal cervical lordosis which may be positional or due to muscle spasm.  Skull base and vertebrae: No acute fracture.  Soft tissues and spinal canal: No prevertebral fluid or swelling. No visible canal hematoma.  Disc levels: Multilevel degenerative changes with endplate irregularity and disc space narrowing.  Upper chest: Negative.  Other: None  IMPRESSION: 1. No acute intracranial hemorrhage. 2. No acute/traumatic cervical spine pathology.  Electronically Signed   By: Anner Crete M.D.   On: 08/04/2019 17:24 CT Head Wo Contrast CLINICAL DATA:  69 year old female with syncope and head trauma.  EXAM: CT HEAD WITHOUT CONTRAST  CT CERVICAL SPINE WITHOUT CONTRAST  TECHNIQUE: Multidetector CT imaging of the head and cervical spine was performed following the standard protocol without intravenous contrast. Multiplanar CT image reconstructions of the cervical spine were also generated.  COMPARISON:  None.  FINDINGS: CT HEAD FINDINGS  Brain: The ventricles and sulci appropriate size for patient's age. Minimal periventricular and deep white matter chronic microvascular ischemic  changes noted. There is no acute intracranial hemorrhage. No mass effect or midline shift. No extra-axial fluid collection.  Vascular: No hyperdense vessel or unexpected calcification.  Skull: Normal. Negative for fracture or focal lesion.  Sinuses/Orbits: No acute finding.  Other: None  CT CERVICAL SPINE FINDINGS  Alignment: No acute subluxation. There is  straightening of normal cervical lordosis which may be positional or due to muscle spasm.  Skull base and vertebrae: No acute fracture.  Soft tissues and spinal canal: No prevertebral fluid or swelling. No visible canal hematoma.  Disc levels: Multilevel degenerative changes with endplate irregularity and disc space narrowing.  Upper chest: Negative.  Other: None  IMPRESSION: 1. No acute intracranial hemorrhage. 2. No acute/traumatic cervical spine pathology.  Electronically Signed   By: Anner Crete M.D.   On: 08/04/2019 17:24  Assessment  The primary encounter diagnosis was Chronic pain syndrome. Diagnoses of Chronic low back pain (Primary Area of Pain) (Bilateral) (R>L) w/ sciatica (Bilateral), Chronic lower extremity pain (Secondary Area of Pain) (Bilateral) (R>L), Chronic neck pain (Tertiary Area of Pain) (Bilateral) (R>L), Chronic upper extremity pain (Fourth Area of Pain) (Bilateral) (R>L), Pharmacologic therapy, and Neurogenic pain were also pertinent to this visit.  Plan of Care  Problem-specific:  No problem-specific Assessment & Plan notes found for this encounter.  Andrea Yang has a current medication list which includes the following long-term medication(s): albuterol, amlodipine, calcium carbonate, cetirizine, diphenhydramine, famotidine, fluticasone, gabapentin, melatonin, nebivolol, and sucralfate.  Pharmacotherapy (Medications Ordered): Meds ordered this encounter  Medications  . gabapentin (NEURONTIN) 300 MG capsule    Sig: Take 1 capsule (300 mg total) by mouth 2 (two) times daily AND 2 capsules (600 mg total) at bedtime.    Dispense:  120 capsule    Refill:  5    Fill one day early if pharmacy is closed on scheduled refill date. May substitute for generic if available.   Orders:  No orders of the defined types were placed in this encounter.  Follow-up plan:   Return in about 6 months (around 07/21/2020) for (F2F), (MM).      Considering:    NOTE: KENALOG (Use Decadron), IODINE, CONTRAST, and LATEX Allergy. Possible bilateral lumbar facet RFA Diagnostic right-sided sacroiliac joint block Possible right-sided sacroiliac joint RFA DiagnosticrightCESI Possible bilateral cervical facet RFA Diagnostic bilateral L4 TFESI Diagnostic right-sided L3-4 LESI Diagnostic TPI/MNB Diagnosticlidocaine infusion   Palliative PRN treatment(s):   Palliative/therapeutic left L4-5 LESI #3 (using Decadron) Diagnostic bilateral lumbar facet block #2 Diagnostic bilateral cervical facet nerve block #2      Recent Visits No visits were found meeting these conditions. Showing recent visits within past 90 days and meeting all other requirements Today's Visits Date Type Provider Dept  01/26/20 Telemedicine Milinda Pointer, MD [facility]  Showing today's visits and meeting all other requirements Future Appointments No visits were found meeting these conditions. Showing future appointments within next 90 days and meeting all other requirements  I discussed the assessment and treatment plan with the patient. The patient was provided an opportunity to ask questions and all were answered. The patient agreed with the plan and demonstrated an understanding of the instructions.  Patient advised to call back or seek an in-person evaluation if the symptoms or condition worsens.  Duration of encounter: 13 minutes.  Note by: Gaspar Cola, MD Date: 01/26/2020; Time: 8:43 AM

## 2020-01-26 ENCOUNTER — Encounter: Payer: Self-pay | Admitting: Pain Medicine

## 2020-01-26 ENCOUNTER — Other Ambulatory Visit: Payer: Self-pay

## 2020-01-26 ENCOUNTER — Ambulatory Visit: Payer: Medicare Other | Attending: Pain Medicine | Admitting: Pain Medicine

## 2020-01-26 DIAGNOSIS — M542 Cervicalgia: Secondary | ICD-10-CM | POA: Diagnosis not present

## 2020-01-26 DIAGNOSIS — M5442 Lumbago with sciatica, left side: Secondary | ICD-10-CM | POA: Diagnosis not present

## 2020-01-26 DIAGNOSIS — M79605 Pain in left leg: Secondary | ICD-10-CM

## 2020-01-26 DIAGNOSIS — G894 Chronic pain syndrome: Secondary | ICD-10-CM

## 2020-01-26 DIAGNOSIS — Z79899 Other long term (current) drug therapy: Secondary | ICD-10-CM

## 2020-01-26 DIAGNOSIS — M5441 Lumbago with sciatica, right side: Secondary | ICD-10-CM

## 2020-01-26 DIAGNOSIS — M79602 Pain in left arm: Secondary | ICD-10-CM

## 2020-01-26 DIAGNOSIS — G8929 Other chronic pain: Secondary | ICD-10-CM

## 2020-01-26 DIAGNOSIS — M79601 Pain in right arm: Secondary | ICD-10-CM

## 2020-01-26 DIAGNOSIS — M792 Neuralgia and neuritis, unspecified: Secondary | ICD-10-CM

## 2020-01-26 DIAGNOSIS — M79604 Pain in right leg: Secondary | ICD-10-CM

## 2020-01-26 MED ORDER — GABAPENTIN 300 MG PO CAPS: ORAL_CAPSULE | ORAL | 5 refills | Status: DC

## 2020-07-21 ENCOUNTER — Other Ambulatory Visit: Payer: Self-pay

## 2020-07-21 ENCOUNTER — Ambulatory Visit
Admission: RE | Admit: 2020-07-21 | Discharge: 2020-07-21 | Disposition: A | Discharge status: Home / Self Care | Payer: Medicare Other | Source: Ambulatory Visit | Attending: Pain Medicine | Admitting: Pain Medicine

## 2020-07-21 ENCOUNTER — Encounter: Payer: Self-pay | Admitting: Pain Medicine

## 2020-07-21 ENCOUNTER — Ambulatory Visit (HOSPITAL_BASED_OUTPATIENT_CLINIC_OR_DEPARTMENT_OTHER): Payer: Medicare Other | Admitting: Pain Medicine

## 2020-07-21 VITALS — BP 166/83 | HR 50 | Temp 97.2°F | Resp 18 | Ht 61.0 in | Wt 174.0 lb

## 2020-07-21 DIAGNOSIS — M542 Cervicalgia: Secondary | ICD-10-CM

## 2020-07-21 DIAGNOSIS — M79675 Pain in left toe(s): Secondary | ICD-10-CM | POA: Insufficient documentation

## 2020-07-21 DIAGNOSIS — M25541 Pain in joints of right hand: Secondary | ICD-10-CM | POA: Insufficient documentation

## 2020-07-21 DIAGNOSIS — G894 Chronic pain syndrome: Secondary | ICD-10-CM

## 2020-07-21 DIAGNOSIS — Z789 Other specified health status: Secondary | ICD-10-CM | POA: Insufficient documentation

## 2020-07-21 DIAGNOSIS — G8929 Other chronic pain: Secondary | ICD-10-CM

## 2020-07-21 DIAGNOSIS — M25542 Pain in joints of left hand: Secondary | ICD-10-CM | POA: Insufficient documentation

## 2020-07-21 DIAGNOSIS — M79601 Pain in right arm: Secondary | ICD-10-CM

## 2020-07-21 DIAGNOSIS — M79602 Pain in left arm: Secondary | ICD-10-CM | POA: Insufficient documentation

## 2020-07-21 DIAGNOSIS — G4701 Insomnia due to medical condition: Secondary | ICD-10-CM

## 2020-07-21 DIAGNOSIS — M792 Neuralgia and neuritis, unspecified: Secondary | ICD-10-CM

## 2020-07-21 DIAGNOSIS — M5441 Lumbago with sciatica, right side: Secondary | ICD-10-CM

## 2020-07-21 DIAGNOSIS — Z79899 Other long term (current) drug therapy: Secondary | ICD-10-CM | POA: Insufficient documentation

## 2020-07-21 DIAGNOSIS — M79604 Pain in right leg: Secondary | ICD-10-CM

## 2020-07-21 DIAGNOSIS — M79605 Pain in left leg: Secondary | ICD-10-CM | POA: Insufficient documentation

## 2020-07-21 DIAGNOSIS — M899 Disorder of bone, unspecified: Secondary | ICD-10-CM

## 2020-07-21 DIAGNOSIS — M79674 Pain in right toe(s): Secondary | ICD-10-CM | POA: Insufficient documentation

## 2020-07-21 DIAGNOSIS — M5442 Lumbago with sciatica, left side: Secondary | ICD-10-CM | POA: Insufficient documentation

## 2020-07-21 MED ORDER — MELATONIN 10 MG PO CAPS: 10.0000 mg | ORAL_CAPSULE | Freq: Every evening | ORAL | 2 refills | Status: AC | PRN

## 2020-07-21 MED ORDER — GABAPENTIN 300 MG PO CAPS: ORAL_CAPSULE | ORAL | 2 refills | Status: AC

## 2020-07-21 NOTE — Progress Notes (Signed)
PROVIDER NOTE: Information contained herein reflects review and annotations entered in association with encounter. Interpretation of such information and data should be left to medically-trained personnel. Information provided to patient can be located elsewhere in the medical record under "Patient Instructions". Document created using STT-dictation technology, any transcriptional errors that may result from process are unintentional.    Patient: Andrea Yang  Service Category: E/M  Provider: Gaspar Cola, MD  DOB: 02/08/1951  DOS: 07/21/2020  Specialty: Interventional Pain Management  MRN: [id number]  Setting: Ambulatory outpatient  PCP: Renee Rival, NP  Type: Established Patient    Referring Provider: Renee Rival, NP  Location: Office  Delivery: Face-to-face     HPI  Ms. Safaa Stingley, a 69 y.o. year old female, is here today because of her Chronic pain syndrome [G89.4]. Ms. Verbeek primary complain today is Back Pain (low), Leg Pain (bilateral, posterior, lateral), Hand Pain (left pointer finger), and Pain (back of her head) Last encounter: My last encounter with her was on Visit date not found. Pertinent problems: Ms. Mohs has Chronic abdominal pain; Fibromyalgia syndrome; Chronic low back pain (Primary Area of Pain) (Bilateral) (R>L) w/ sciatica (Bilateral); Chronic lower extremity pain (Secondary Area of Pain) (Bilateral) (R>L); Chronic neck pain (Tertiary Area of Pain) (Bilateral) (R>L); Chronic pain syndrome; Chronic sacroiliac joint pain (Right); Chronic upper extremity pain (Fourth Area of Pain) (Bilateral) (R>L); Chronic low back pain (Primary Area of Pain) (Bilateral) (R>L); Chronic generalized pain; Chronic musculoskeletal pain; Neurogenic pain; Cervicalgia; Chronic sacroiliac joint sclerosis (Right); Osteoarthritis of lumbar spine; 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, L5-S1) (Bilateral); Lumbar facet syndrome (Bilateral) (R>L); Spondylosis without myelopathy  or radiculopathy, lumbosacral region; Other specified dorsopathies, sacral and sacrococcygeal region; Lumbar Levoscoliosis; Lumbar Grade 1 (4 mm) Anterolisthesis of L4/L5 on flexion; Lumbar spine instability (L4 over L5) (4 mm displacement on flexion); DDD (degenerative disc disease), lumbar; DDD (degenerative disc disease), cervical; Cervical facet hypertrophy (Bilateral); 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); Cervical facet syndrome (Bilateral) (R>L); Cervical radiculitis (Bilateral) (R>L); Lumbar radiculitis (Bilateral) (R>L); Other intervertebral disc degeneration, lumbar region; Lumbar facet arthropathy (L2-3, L3-4, L4-5, L5-S1); Lumbar foraminal stenosis (Multilevel) (Bilateral: L2-3, L3-4, L4-5, L5-S1) (severe at right L4-5); Abnormal MRI, cervical spine (05/15/2018); Cervical facet arthropathy (Multilevel) (Bilateral); Abnormal MRI, lumbar spine (05/15/2018); Insomnia secondary to chronic pain; Spondylosis without myelopathy or radiculopathy, cervical region; Joint pain in fingers of both hands; and Chronic pain of toes of both feet (R>L) on their pertinent problem list. Pain Assessment: Severity of Chronic pain is reported as a 2 /10. Location: Back Lower/legs. Onset: More than a month ago. Quality: Aching, Constant. Timing: Constant. Modifying factor(s): moving, walking, distraction, medication, heat, rest. Vitals:  height is 5' 1"  (1.549 m) and weight is 174 lb (78.9 kg). Her temporal temperature is 97.2 F (36.2 C) (abnormal). Her blood pressure is 166/83 (abnormal) and her pulse is 50 (abnormal). Her respiration is 18 and oxygen saturation is 100%.   Reason for encounter: medication management. Difficulty with short term memory (fuzzy). Has not seen PCP. Caffeine makes pain hurt more. [ Caffeine works as a nonselective blocker of adenosine receptors (A1, A2a, A2b and A3) and has been related to the regulation of heart rate, the contraction/relaxation of cardiac and smooth  muscles, and the neural signaling in the central nervous system (CNS). Since the late 1990s, studies using adenosine receptor antagonists, such as Caffeine, to block the A1 and A2a adenosine receptor subtypes have shown to reduce the physical, cellular  and molecular damages caused by a spinal cord injury (SCI) or a stroke (cerebral infarction) and by other neurodegenerative diseases such as Parkinson's and Alzheimer's diseases. Adenosine is a purine nucleoside, responsible for the regulation of multiple physiological and pathological cellular and tissue functions by activation of four G protein-coupled receptors (GPCR), namely A1, A2A, A2B, and A3 adenosine receptors (ARs). In recent years, extensive progress has been made to elucidate the role of adenosine in pain regulation. Most of the antinociceptive effects of adenosine are dependent upon A1AR activation located at peripheral, spinal, and supraspinal sites. The role of A2AAR and A2BAR is more controversial since their activation has both pro- and anti-nociceptive effects. ]   Today I informed the patient that we will be transferring the nonopioids to her PCP.  We will continue to be available for her.  She needed again for evaluation of her pain or interventional therapies.  Today the patient indicated having some pain in her fingers and toes as she is concerned about some ulnar deviation that she is experiencing in both of her index fingers.  Today I will go ahead and order some blood work and x-rays to assess for the possibility of any rheumatological problems.  I will schedule her to return after she has completed this for follow-up evaluation.  RTCB: PRN Nonopioids transfer 07/21/2020: Melatonin and gabapentin  Pharmacotherapy Assessment   Analgesic: Not using opioid analgesics.   Monitoring: [place] PMP: PDMP reviewed during this encounter.       Pharmacotherapy: No side-effects or adverse reactions reported. Compliance: No problems  identified. Effectiveness: Clinically acceptable.  Hart Rochester, RN  07/21/2020  8:39 AM  Sign when Signing Visit Safety precautions to be maintained throughout the outpatient stay will include: orient to surroundings, keep bed in low position, maintain call bell within reach at all times, provide assistance with transfer out of bed and ambulation.     UDS:  Summary  Date Value Ref Range Status  12/10/2017 FINAL  Final    Comment:    ==================================================================== [facility] COMP DRUG ANALYSIS,UR ==================================================================== Test                             Result       Flag       Units Drug Present and Declared for Prescription Verification   Gabapentin                     PRESENT      EXPECTED Drug Present not Declared for Prescription Verification   Metoprolol                     PRESENT      UNEXPECTED Drug Absent but Declared for Prescription Verification   Cyclobenzaprine                Not Detected UNEXPECTED   Diphenhydramine                Not Detected UNEXPECTED ==================================================================== Test                      Result    Flag   Units      Ref Range   Creatinine              118              mg/dL      >=20 ==================================================================== Declared Medications:  The flagging and interpretation on this report are based on the  following declared medications.  Unexpected results may arise from  inaccuracies in the declared medications.  **Note: The testing scope of this panel includes these medications:  Cyclobenzaprine  Diphenhydramine  Gabapentin  **Note: The testing scope of this panel does not include following  reported medications:  Albuterol  Amlodipine  Calcium carbonate  Cetirizine  Cholecalciferol  Fluconazole  Fluticasone  Multivitamin  Polyethylene Glycol  Sucralfate  Supplement  (Primrose) ==================================================================== For clinical consultation, please call [phone]. ====================================================================      ROS  Constitutional: Denies any fever or chills Gastrointestinal: No reported hemesis, hematochezia, vomiting, or acute GI distress Musculoskeletal: Denies any acute onset joint swelling, redness, loss of ROM, or weakness Neurological: No reported episodes of acute onset apraxia, aphasia, dysarthria, agnosia, amnesia, paralysis, loss of coordination, or loss of consciousness  Medication Review  Evening Primrose Oil, Magnesium Oxide, Melatonin, Probiotic Product, albuterol, amLODipine, calcium carbonate, cetirizine, cholecalciferol, diphenhydrAMINE, famotidine, fluticasone, gabapentin, multivitamin, nebivolol, polyethylene glycol, rosuvastatin, and sucralfate  History Review  Allergy: Ms. Lopresti is allergic to dicyclomine, minoxidil, triamcinolone acetonide, valacyclovir, amantadines, cymbalta [duloxetine hcl], erythromycin, flexeril [cyclobenzaprine], hydrochlorothiazide, iodine, meloxicam, nexium [esomeprazole magnesium], prednisone, prilosec [omeprazole], savella [milnacipran hcl], cleocin [clindamycin hcl], cleocin [clindamycin], fluocinolone acetonide, latex, and tramadol. Drug: Ms. Pirozzi  reports no history of drug use. Alcohol:  reports no history of alcohol use. Tobacco:  reports that she has never smoked. She has never used smokeless tobacco. Social: Ms. Keiper  reports that she has never smoked. She has never used smokeless tobacco. She reports that she does not drink alcohol and does not use drugs. Medical:  has a past medical history of Chronic constipation, Dysrhythmia, Fatty liver disease, nonalcoholic, Fibromyalgia, GERD (gastroesophageal reflux disease), History of hiatal hernia, and Hypertension. Surgical: Ms. Liscano  has a past surgical history that includes Colonoscopy  with propofol (N/A, 09/27/2015); Abdominal hysterectomy; Breast surgery; and Esophagogastroduodenoscopy (egd) with propofol (N/A, 10/29/2019). Family: family history is not on file.  Laboratory Chemistry Profile   Renal Lab Results  Component Value Date   BUN 29 (H) 08/04/2019   CREATININE 0.98 08/04/2019   BCR 15 12/11/2017   GFRAA >60 08/04/2019   GFRNONAA 59 (L) 08/04/2019     Hepatic Lab Results  Component Value Date   AST 22 08/04/2019   ALT 16 08/04/2019   ALBUMIN 3.9 08/04/2019   ALKPHOS 66 08/04/2019   LIPASE 31 08/04/2019     Electrolytes Lab Results  Component Value Date   NA 137 08/04/2019   K 3.9 08/04/2019   CL 103 08/04/2019   CALCIUM 9.2 08/04/2019   MG 2.1 12/11/2017     Bone Lab Results  Component Value Date   25OHVITD1 42 12/11/2017   25OHVITD2 1.5 12/11/2017   25OHVITD3 40 12/11/2017     Inflammation (CRP: Acute Phase) (ESR: Chronic Phase) Lab Results  Component Value Date   CRP 7.6 (H) 12/11/2017   ESRSEDRATE 39 12/11/2017       Note: Above Lab results reviewed.  Recent Imaging Review  CT ABDOMEN PELVIS WO CONTRAST CLINICAL DATA:  Question of diverticulitis, abdominal pain  EXAM: CT ABDOMEN AND PELVIS WITHOUT CONTRAST  TECHNIQUE: Multidetector CT imaging of the abdomen and pelvis was performed following the standard protocol without IV contrast.  COMPARISON:  May 10, 2015  FINDINGS: Lower chest: The visualized heart size within normal limits. No pericardial fluid/thickening.  No hiatal hernia.  The visualized portions of the lungs are clear.  Hepatobiliary: Although limited due to the lack of intravenous contrast, normal in appearance without gross focal abnormality. No calcified gallstones. The gallbladder appears contracted.  Pancreas:  Unremarkable.  No surrounding inflammatory changes.  Spleen: Normal in size. Although limited due to the lack of intravenous contrast, normal in appearance.  Adrenals/Urinary  Tract: Both adrenal glands appear normal. There is a 7 mm calculus seen in the lower pole of the right kidney. There is also a 4 cm low-density lesion seen partially exophytic off the lower pole of the right kidney, likely renal cyst. No left-sided renal calculi are noted. No ureteral or bladder calculi seen. No hydronephrosis.  Stomach/Bowel: The stomach, small bowel, and colon are normal in appearance. No inflammatory changes or obstructive findings. Scattered colonic diverticula are noted without diverticulitis. There is a moderate amount right colonic stool present.  Vascular/Lymphatic: There are no enlarged abdominal or pelvic lymph nodes. Scattered aortic atherosclerotic calcifications are seen without aneurysmal dilatation.  Reproductive: The patient is status post hysterectomy. No adnexal masses or collections seen.  Other: Adjacent to the right hepatic lobe there is a low-density cystic mass within the internal oblique and transversus abdominal musculature. This measures 6.5 x 2.2 cm and is grossly unchanged from the prior exam. A small fat containing umbilical hernia seen.  Musculoskeletal: No acute or significant osseous findings.  IMPRESSION: 1. Nonobstructing right renal calculus 2. Diverticula without diverticulitis 3. Moderate to large amount of colonic stool 4.  Aortic Atherosclerosis (ICD10-I70.0). 5. Unchanged cystic mass within the right abdominal wall musculature  Electronically Signed   By: Prudencio Pair M.D.   On: 08/04/2019 19:29 CT Cervical Spine Wo Contrast CLINICAL DATA:  69 year old female with syncope and head trauma.  EXAM: CT HEAD WITHOUT CONTRAST  CT CERVICAL SPINE WITHOUT CONTRAST  TECHNIQUE: Multidetector CT imaging of the head and cervical spine was performed following the standard protocol without intravenous contrast. Multiplanar CT image reconstructions of the cervical spine were also generated.  COMPARISON:  None.  FINDINGS: CT  HEAD FINDINGS  Brain: The ventricles and sulci appropriate size for patient's age. Minimal periventricular and deep white matter chronic microvascular ischemic changes noted. There is no acute intracranial hemorrhage. No mass effect or midline shift. No extra-axial fluid collection.  Vascular: No hyperdense vessel or unexpected calcification.  Skull: Normal. Negative for fracture or focal lesion.  Sinuses/Orbits: No acute finding.  Other: None  CT CERVICAL SPINE FINDINGS  Alignment: No acute subluxation. There is straightening of normal cervical lordosis which may be positional or due to muscle spasm.  Skull base and vertebrae: No acute fracture.  Soft tissues and spinal canal: No prevertebral fluid or swelling. No visible canal hematoma.  Disc levels: Multilevel degenerative changes with endplate irregularity and disc space narrowing.  Upper chest: Negative.  Other: None  IMPRESSION: 1. No acute intracranial hemorrhage. 2. No acute/traumatic cervical spine pathology.  Electronically Signed   By: Anner Crete M.D.   On: 08/04/2019 17:24 CT Head Wo Contrast CLINICAL DATA:  69 year old female with syncope and head trauma.  EXAM: CT HEAD WITHOUT CONTRAST  CT CERVICAL SPINE WITHOUT CONTRAST  TECHNIQUE: Multidetector CT imaging of the head and cervical spine was performed following the standard protocol without intravenous contrast. Multiplanar CT image reconstructions of the cervical spine were also generated.  COMPARISON:  None.  FINDINGS: CT HEAD FINDINGS  Brain: The ventricles and sulci appropriate size for patient's age. Minimal periventricular and deep white matter chronic microvascular ischemic changes noted. There is no acute intracranial hemorrhage.  No mass effect or midline shift. No extra-axial fluid collection.  Vascular: No hyperdense vessel or unexpected calcification.  Skull: Normal. Negative for fracture or focal  lesion.  Sinuses/Orbits: No acute finding.  Other: None  CT CERVICAL SPINE FINDINGS  Alignment: No acute subluxation. There is straightening of normal cervical lordosis which may be positional or due to muscle spasm.  Skull base and vertebrae: No acute fracture.  Soft tissues and spinal canal: No prevertebral fluid or swelling. No visible canal hematoma.  Disc levels: Multilevel degenerative changes with endplate irregularity and disc space narrowing.  Upper chest: Negative.  Other: None  IMPRESSION: 1. No acute intracranial hemorrhage. 2. No acute/traumatic cervical spine pathology.  Electronically Signed   By: Anner Crete M.D.   On: 08/04/2019 17:24 Note: Reviewed        Physical Exam  General appearance: Well nourished, well developed, and well hydrated. In no apparent acute distress Mental status: Alert, oriented x 3 (person, place, & time)       Respiratory: No evidence of acute respiratory distress Eyes: PERLA Vitals: BP (!) 166/83   Pulse (!) 50   Temp (!) 97.2 F (36.2 C) (Temporal)   Resp 18   Ht 5' 1"  (1.549 m)   Wt 174 lb (78.9 kg)   SpO2 100%   BMI 32.88 kg/m  BMI: Estimated body mass index is 32.88 kg/m as calculated from the following:   Height as of this encounter: 5' 1"  (1.549 m).   Weight as of this encounter: 174 lb (78.9 kg). Ideal: Ideal body weight: 47.8 kg (105 lb 6.1 oz) Adjusted ideal body weight: 60.2 kg (132 lb 13.2 oz)  Assessment   Status Diagnosis  Controlled Controlled Controlled 1. Chronic pain syndrome   2. Chronic low back pain (Primary Area of Pain) (Bilateral) (R>L) w/ sciatica (Bilateral)   3. Chronic lower extremity pain (Secondary Area of Pain) (Bilateral) (R>L)   4. Chronic neck pain (Tertiary Area of Pain) (Bilateral) (R>L)   5. Chronic upper extremity pain (Fourth Area of Pain) (Bilateral) (R>L)   6. Pharmacologic therapy   7. Insomnia secondary to chronic pain   8. Neurogenic pain   9. Disorder of  skeletal system   10. Problems influencing health status   11. Joint pain in fingers of both hands   12. Chronic pain of toes of both feet (R>L)      Updated Problems: Problem  Joint Pain in Fingers of Both Hands  Chronic pain of toes of both feet (R>L)    Plan of Care  Problem-specific:  No problem-specific Assessment & Plan notes found for this encounter.  Ms. Daney Moor has a current medication list which includes the following long-term medication(s): albuterol, amlodipine, calcium carbonate, cetirizine, diphenhydramine, famotidine, fluticasone, nebivolol, rosuvastatin, sucralfate, [START ON 07/24/2020] gabapentin, and [START ON 07/24/2020] melatonin.  Pharmacotherapy (Medications Ordered): Meds ordered this encounter  Medications  . Melatonin 10 MG CAPS    Sig: Take 10-20 mg by mouth at bedtime as needed.    Dispense:  60 capsule    Refill:  2    Fill one day early if pharmacy is closed on scheduled refill date. Generic permitted. Do not send renewal requests. Void any older duplicate prescription or refill(s) that may be on file.  . gabapentin (NEURONTIN) 300 MG capsule    Sig: Take 1 capsule (300 mg total) by mouth 2 (two) times daily AND 2 capsules (600 mg total) at bedtime.    Dispense:  120 capsule  Refill:  2    Fill one day early if pharmacy is closed on scheduled refill date. May substitute for generic if available.   Orders:  Orders Placed This Encounter  Procedures  . DG Foot Complete Left    Please evaluate degenerative changes.  Patient currently being worked up for gout    Standing Status:   Future    Number of Occurrences:   1    Standing Expiration Date:   08/21/2020    Scheduling Instructions:     Imaging must be done as soon as possible. Inform patient that order will expire within 30 days and I will not renew it.    Order Specific Question:   Reason for Exam (SYMPTOM  OR DIAGNOSIS REQUIRED)    Answer:   Chronic foot pain with acute pain on the great  toe    Order Specific Question:   Preferred imaging location?    Answer:   [facility]    Order Specific Question:   Call Results- Best Contact Number?    Answer:   [phone] ([facility])    Order Specific Question:   Radiology Contrast Protocol - do NOT remove file path    Answer:   \\[email or web address]s.pdf    Order Specific Question:   Release to patient    Answer:   Immediate  . DG Foot Complete Right    Please evaluate for possible degenerative changes.  Patient currently being worked up for gout.    Standing Status:   Future    Number of Occurrences:   1    Standing Expiration Date:   08/21/2020    Scheduling Instructions:     Imaging must be done as soon as possible. Inform patient that order will expire within 30 days and I will not renew it.    Order Specific Question:   Reason for Exam (SYMPTOM  OR DIAGNOSIS REQUIRED)    Answer:   Chronic foot pain with acute pain on the great toe    Order Specific Question:   Preferred imaging location?    Answer:   [facility]    Order Specific Question:   Call Results- Best Contact Number?    Answer:   [phone] ([facility])    Order Specific Question:   Radiology Contrast Protocol - do NOT remove file path    Answer:   \\[email or web address]s.[email or web address]    Order Specific Question:   Release to patient    Answer:   Immediate  . DG Hand Complete Left    Please evaluate and described any possible arthritic degenerative changes    Standing Status:   Future    Number of Occurrences:   1    Standing Expiration Date:   08/21/2020    Scheduling Instructions:     Imaging must be done as soon as possible. Inform patient that order will expire within 30 days and I will not renew it.    Order Specific Question:   Reason for Exam (SYMPTOM  OR DIAGNOSIS REQUIRED)    Answer:   Chronic pain of distal interphalangeal joint, more so on the index finger with ulnar  deviation    Order Specific Question:   Preferred imaging location?    Answer:   [facility]    Order Specific Question:   Call Results- Best Contact Number?    Answer:   [phone] ([facility])    Order Specific Question:   Radiology Contrast Protocol - do NOT remove file path  Answer:   \\[email or web address]s.pdf    Order Specific Question:   Release to patient    Answer:   Immediate  . DG Hand Complete Right    Please evaluate and describe any possible arthritic degenerative changes    Standing Status:   Future    Number of Occurrences:   1    Standing Expiration Date:   08/21/2020    Scheduling Instructions:     Imaging must be done as soon as possible. Inform patient that order will expire within 30 days and I will not renew it.    Order Specific Question:   Reason for Exam (SYMPTOM  OR DIAGNOSIS REQUIRED)    Answer:   Chronic pain of distal interphalangeal joint, more so on the index finger with ulnar deviation    Order Specific Question:   Preferred imaging location?    Answer:   [facility]    Order Specific Question:   Call Results- Best Contact Number?    Answer:   [phone] ([facility])    Order Specific Question:   Radiology Contrast Protocol - do NOT remove file path    Answer:   \\[email or web address]\Radiant\DXFluoroContrastProtocols.pdf    Order Specific Question:   Release to patient    Answer:   Immediate  . Comp. Metabolic Panel (12)    With GFR. Indications: Chronic Pain Syndrome (G89.4) & Pharmacotherapy (Y19.509)    Order Specific Question:   Has the patient fasted?    Answer:   No    Order Specific Question:   CC Results    Answer:   [id number] [[id number]]    Order Specific Question:   Release to patient    Answer:   Immediate  . Magnesium    Indication: Pharmacologic therapy (Z79.899)    Order Specific Question:   CC Results    Answer:   PCP-NURSE [[id number]]    Order Specific Question:   Release to  patient    Answer:   Immediate  . Vitamin B12    Indication: Pharmacologic therapy (L97.673).    Order Specific Question:   CC Results    Answer:   [id number] [[id number]]    Order Specific Question:   Release to patient    Answer:   Immediate  . Sedimentation rate    Indication: Disorder of skeletal system (M89.9)    Order Specific Question:   CC Results    Answer:   PCP-NURSE [[id number]]    Order Specific Question:   Release to patient    Answer:   Immediate  . 25-Hydroxy vitamin D Lcms D2+D3    Indication: Disorder of skeletal system (M89.9).    Order Specific Question:   CC Results    Answer:   [id number] [[id number]]    Order Specific Question:   Release to patient    Answer:   Immediate  . C-reactive protein    Indication: Problems influencing health status (Z78.9)    Order Specific Question:   CC Results    Answer:   PCP-NURSE [[id number]]    Order Specific Question:   Release to patient    Answer:   Immediate  . Uric acid, random urine    Order Specific Question:   Release to patient    Answer:   Immediate  . Uric acid    Order Specific Question:   CC Results    Answer:   [id number] [[id number]]    Order Specific Question:   Release to patient    Answer:  Immediate   Follow-up plan:   Return in about 2 weeks (around 08/04/2020) for (F2F), (s/p Tests).      Considering:   NOTE: KENALOG (Use Decadron), IODINE, CONTRAST, and LATEX Allergy. Possible bilateral lumbar facet RFA Diagnostic right-sided sacroiliac joint block Possible right-sided sacroiliac joint RFA DiagnosticrightCESI Possible bilateral cervical facet RFA Diagnostic bilateral L4 TFESI Diagnostic right-sided L3-4 LESI Diagnostic TPI/MNB Diagnosticlidocaine infusion   Palliative PRN treatment(s):   Palliative/therapeutic left L4-5 LESI #3 (using Decadron) Diagnostic bilateral lumbar facet block #2 Diagnostic bilateral cervical facet nerve block #2     Recent Visits No visits were found meeting these  conditions. Showing recent visits within past 90 days and meeting all other requirements Today's Visits Date Type Provider Dept  07/21/20 Office Visit Milinda Pointer, MD [facility]  Showing today's visits and meeting all other requirements Future Appointments Date Type Provider Dept  08/05/20 Appointment Milinda Pointer, MD [facility]  Showing future appointments within next 90 days and meeting all other requirements  I discussed the assessment and treatment plan with the patient. The patient was provided an opportunity to ask questions and all were answered. The patient agreed with the plan and demonstrated an understanding of the instructions.  Patient advised to call back or seek an in-person evaluation if the symptoms or condition worsens.  Duration of encounter: 57 minutes.  Note by: Gaspar Cola, MD Date: 07/21/2020; Time: 12:31 PM

## 2020-07-21 NOTE — Progress Notes (Signed)
Safety precautions to be maintained throughout the outpatient stay will include: orient to surroundings, keep bed in low position, maintain call bell within reach at all times, provide assistance with transfer out of bed and ambulation.  

## 2020-07-26 LAB — SEDIMENTATION RATE: Sed Rate: 31 mm/hr (ref 0–40)

## 2020-07-26 LAB — MAGNESIUM: Magnesium: 2 mg/dL (ref 1.6–2.3)

## 2020-07-26 LAB — COMP. METABOLIC PANEL (12)
AST: 22 IU/L (ref 0–40)
Albumin/Globulin Ratio: 1.4 (ref 1.2–2.2)
Albumin: 4.2 g/dL (ref 3.8–4.8)
Alkaline Phosphatase: 75 IU/L (ref 44–121)
BUN/Creatinine Ratio: 20 (ref 12–28)
BUN: 18 mg/dL (ref 8–27)
Bilirubin Total: 0.3 mg/dL (ref 0.0–1.2)
Calcium: 9.4 mg/dL (ref 8.7–10.3)
Chloride: 104 mmol/L (ref 96–106)
Creatinine, Ser: 0.89 mg/dL (ref 0.57–1.00)
GFR calc Af Amer: 76 mL/min/{1.73_m2} (ref >59)
GFR calc non Af Amer: 66 mL/min/{1.73_m2} (ref >59)
Globulin, Total: 2.9 g/dL (ref 1.5–4.5)
Glucose: 87 mg/dL (ref 65–99)
Potassium: 4.7 mmol/L (ref 3.5–5.2)
Sodium: 140 mmol/L (ref 134–144)
Total Protein: 7.1 g/dL (ref 6.0–8.5)

## 2020-07-26 LAB — 25-HYDROXY VITAMIN D LCMS D2+D3
25-Hydroxy, Vitamin D-2: 1.1 ng/mL
25-Hydroxy, Vitamin D-3: 44 ng/mL
25-Hydroxy, Vitamin D: 45 ng/mL

## 2020-07-26 LAB — URIC ACID: Uric Acid: 4.6 mg/dL (ref 3.0–7.2)

## 2020-07-26 LAB — C-REACTIVE PROTEIN: CRP: 8 mg/L (ref 0–10)

## 2020-07-26 LAB — VITAMIN B12: Vitamin B-12: 791 pg/mL (ref 232–1245)

## 2020-08-05 ENCOUNTER — Encounter: Payer: Self-pay | Admitting: Pain Medicine

## 2020-08-05 ENCOUNTER — Ambulatory Visit: Payer: Medicare Other | Attending: Pain Medicine | Admitting: Pain Medicine

## 2020-08-05 ENCOUNTER — Other Ambulatory Visit: Payer: Self-pay

## 2020-08-05 VITALS — BP 134/61 | HR 72 | Temp 98.7°F | Resp 16 | Ht 61.0 in | Wt 175.0 lb

## 2020-08-05 DIAGNOSIS — M79675 Pain in left toe(s): Secondary | ICD-10-CM | POA: Diagnosis present

## 2020-08-05 DIAGNOSIS — M5441 Lumbago with sciatica, right side: Secondary | ICD-10-CM

## 2020-08-05 DIAGNOSIS — J309 Allergic rhinitis, unspecified: Secondary | ICD-10-CM | POA: Insufficient documentation

## 2020-08-05 DIAGNOSIS — Z6832 Body mass index (BMI) 32.0-32.9, adult: Secondary | ICD-10-CM | POA: Insufficient documentation

## 2020-08-05 DIAGNOSIS — M79642 Pain in left hand: Secondary | ICD-10-CM | POA: Diagnosis present

## 2020-08-05 DIAGNOSIS — E782 Mixed hyperlipidemia: Secondary | ICD-10-CM | POA: Insufficient documentation

## 2020-08-05 DIAGNOSIS — G894 Chronic pain syndrome: Secondary | ICD-10-CM

## 2020-08-05 DIAGNOSIS — K760 Fatty (change of) liver, not elsewhere classified: Secondary | ICD-10-CM | POA: Insufficient documentation

## 2020-08-05 DIAGNOSIS — M79641 Pain in right hand: Secondary | ICD-10-CM

## 2020-08-05 DIAGNOSIS — M79674 Pain in right toe(s): Secondary | ICD-10-CM

## 2020-08-05 DIAGNOSIS — I499 Cardiac arrhythmia, unspecified: Secondary | ICD-10-CM | POA: Insufficient documentation

## 2020-08-05 DIAGNOSIS — M79602 Pain in left arm: Secondary | ICD-10-CM

## 2020-08-05 DIAGNOSIS — M25542 Pain in joints of left hand: Secondary | ICD-10-CM

## 2020-08-05 DIAGNOSIS — R739 Hyperglycemia, unspecified: Secondary | ICD-10-CM | POA: Insufficient documentation

## 2020-08-05 DIAGNOSIS — M542 Cervicalgia: Secondary | ICD-10-CM | POA: Diagnosis present

## 2020-08-05 DIAGNOSIS — M79605 Pain in left leg: Secondary | ICD-10-CM | POA: Diagnosis present

## 2020-08-05 DIAGNOSIS — G4733 Obstructive sleep apnea (adult) (pediatric): Secondary | ICD-10-CM | POA: Insufficient documentation

## 2020-08-05 DIAGNOSIS — I10 Essential (primary) hypertension: Secondary | ICD-10-CM | POA: Insufficient documentation

## 2020-08-05 DIAGNOSIS — G8929 Other chronic pain: Secondary | ICD-10-CM

## 2020-08-05 DIAGNOSIS — E049 Nontoxic goiter, unspecified: Secondary | ICD-10-CM | POA: Insufficient documentation

## 2020-08-05 DIAGNOSIS — M25541 Pain in joints of right hand: Secondary | ICD-10-CM | POA: Diagnosis not present

## 2020-08-05 DIAGNOSIS — M79601 Pain in right arm: Secondary | ICD-10-CM | POA: Diagnosis present

## 2020-08-05 DIAGNOSIS — M79604 Pain in right leg: Secondary | ICD-10-CM

## 2020-08-05 DIAGNOSIS — J019 Acute sinusitis, unspecified: Secondary | ICD-10-CM | POA: Insufficient documentation

## 2020-08-05 DIAGNOSIS — M151 Heberden's nodes (with arthropathy): Secondary | ICD-10-CM | POA: Diagnosis not present

## 2020-08-05 DIAGNOSIS — M5442 Lumbago with sciatica, left side: Secondary | ICD-10-CM | POA: Diagnosis present

## 2020-08-05 DIAGNOSIS — D72829 Elevated white blood cell count, unspecified: Secondary | ICD-10-CM | POA: Insufficient documentation

## 2020-08-05 NOTE — Progress Notes (Signed)
PROVIDER NOTE: Information contained herein reflects review and annotations entered in association with encounter. Interpretation of such information and data should be left to medically-trained personnel. Information provided to patient can be located elsewhere in the medical record under "Patient Instructions". Document created using STT-dictation technology, any transcriptional errors that may result from process are unintentional.    Patient: Andrea Yang  Service Category: E/M  Provider: Gaspar Cola, MD  DOB: [birth year]  DOS: 08/05/2020  Specialty: Interventional Pain Management  MRN: [id number]  Setting: Ambulatory outpatient  PCP: Andrea Rival, NP  Type: Established Patient    Referring Provider: Renee Rival, NP  Location: Office  Delivery: Face-to-face     HPI  [name], a 69 y.o. year old female, is here today because of her Chronic pain syndrome [G89.4]. [name] primary complain today is Hand Pain (Headache, upper arms, low back and both legs. ) Last encounter: My last encounter with her was on 07/21/2020. Pertinent problems: [name] has Chronic abdominal pain; Fibromyalgia syndrome; Chronic low back pain (Primary Area of Pain) (Bilateral) (R>L) w/ sciatica (Bilateral); Chronic lower extremity pain (Secondary Area of Pain) (Bilateral) (R>L); Chronic neck pain (Tertiary Area of Pain) (Bilateral) (R>L); Chronic pain syndrome; Chronic sacroiliac joint pain (Right); Chronic upper extremity pain (Fourth Area of Pain) (Bilateral) (R>L); Chronic low back pain (Primary Area of Pain) (Bilateral) (R>L); Chronic generalized pain; Chronic musculoskeletal pain; Neurogenic pain; Cervicalgia; Chronic sacroiliac joint sclerosis (Right); Osteoarthritis of lumbar spine; 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, L5-S1) (Bilateral); Lumbar facet syndrome (Bilateral) (R>L); Spondylosis without myelopathy or radiculopathy, lumbosacral region; Other specified dorsopathies,  sacral and sacrococcygeal region; Lumbar Levoscoliosis; Lumbar Grade 1 (4 mm) Anterolisthesis of L4/L5 on flexion; Lumbar spine instability (L4 over L5) (4 mm displacement on flexion); DDD (degenerative disc disease), lumbar; DDD (degenerative disc disease), cervical; Cervical facet hypertrophy (Bilateral); 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); Cervical facet syndrome (Bilateral) (R>L); Cervical radiculitis (Bilateral) (R>L); Lumbar radiculitis (Bilateral) (R>L); Other intervertebral disc degeneration, lumbar region; Lumbar facet arthropathy (L2-3, L3-4, L4-5, L5-S1); Lumbar foraminal stenosis (Multilevel) (Bilateral: L2-3, L3-4, L4-5, L5-S1) (severe at right L4-5); Abnormal MRI, cervical spine (05/15/2018); Cervical facet arthropathy (Multilevel) (Bilateral); Abnormal MRI, lumbar spine (05/15/2018); Insomnia secondary to chronic pain; Spondylosis without myelopathy or radiculopathy, cervical region; Chronic joint pain in hands (Bilateral); Chronic pain of toes of both feet (R>L); Chronic hand pain (Bilateral); and 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 Pain Assessment: Severity of Chronic pain is reported as a 2 /10. Location: Hand Other (Comment) (bilateral)/Back of head to upper arms, hands, low back to back of legs and knees to feet.. Onset: More than a month ago. Quality: Other (Comment),Throbbing,Aching (chilly ache). Timing: Constant. Modifying factor(s): warm bath, sitting, rub salve on them.. Vitals:  height is 5' 1"  (1.549 m) and weight is 175 lb (79.4 kg). Her oral temperature is 98.7 F (37.1 C). Her blood pressure is 134/61 and her pulse is 72. Her respiration is 16 and oxygen saturation is 100%.   Reason for encounter: follow-up evaluation after x-rays and lab work.  The patient came into the clinics today with her husband.  I went ahead and explained the results to the patient.  Her main concern seems  to be whether or not her finger was going to continue deviating (getting crooked).  I told her that this would be likely.  She asked if there was anything that could be  done and she also asked if she taped it to the next finger whether or not it would help, or at least not cause any problems.  I told her that it would be up to her.  I told her that doing that all the time could be somewhat uncomfortable.  She was concerned about having some occasional shooting pains and I suggested that for arthritis the best thing to do is to take some nonsteroidal anti-inflammatory drugs however, she reminded me that she typically has GI intolerance secondary to her history of gastroesophageal reflux disease.  I gave her the option to simply use some creams like the Arnica cream or some Aspercreme.  Diagnostic lab work: Uric acid: WNL  C-reactive protein: WNL  Vitamin D: WNL  Sed rate: WNL  Vitamin B12: WNL  Magnesium: WNL  Comprehensive metabolic panel: WNL   Diagnostic x-rays: Right hand: Negative Left hand: Moderate osteoarthritis of the distal interphalangeal joint (DIP) of the index finger.  No acute abnormality seen in the left hand. Right foot: Negative Left foot: Negative  The patient later indicated that she was beginning to experience some discomfort in the neck in and she was wondering whether or not she could call me to come in and have some shots repeated.  I clarified to the patient that we were not discharging her from the clinic and that she could come back for any pain problems that she may encounter, not just the neck.  She was happy to hear that.  She indicated that she would be contacting me if the pain gets any worse.  She also mentioned having some swelling in her left knee so I asked her to show me both knees in comparing the 2 there does seem to be a little bit of swelling, however it is very mild.  The only way that you can notice is by looking at the wrinkles on the skin of the knee that  has no problems and then comparing the skin to the side that she feels is swollen and it does show less wrinkles suggesting that the skin is a little bit more tight.  I also informed her that she could use the same cream that she is going to be trying in her hands she could also try it over the knee.  I recommended to her that if she begins to experience any significant sharp shooting pains or issues with walking on that knee, but she should feel free to get an appointment with me so that we can go over some options to treated, perhaps with some injections.  Again, she was happy to hear that and she indicated that she would contact us if needed.  At this time the patient's husband then started asking several questions about Covid, which I tried to answer to the best of my abilities.  Pharmacotherapy Assessment   Analgesic: Not using opioid analgesics.   Monitoring: [place] PMP: PDMP reviewed during this encounter.       Pharmacotherapy: No side-effects or adverse reactions reported. Compliance: No problems identified. Effectiveness: Clinically acceptable.  Morley Kos, RN  08/05/2020 12:30 PM  Sign when Signing Visit Safety precautions to be maintained throughout the outpatient stay will include: orient to surroundings, keep bed in low position, maintain call bell within reach at all times, provide assistance with transfer out of bed and ambulation.     UDS:  Summary  Date Value Ref Range Status  12/10/2017 FINAL  Final    Comment:    ====================================================================  [facility] COMP DRUG ANALYSIS,UR ==================================================================== Test                             Result       Flag       Units Drug Present and Declared for Prescription Verification   Gabapentin                     PRESENT      EXPECTED Drug Present not Declared for Prescription Verification   Metoprolol                     PRESENT      UNEXPECTED Drug  Absent but Declared for Prescription Verification   Cyclobenzaprine                Not Detected UNEXPECTED   Diphenhydramine                Not Detected UNEXPECTED ==================================================================== Test                      Result    Flag   Units      Ref Range   Creatinine              118              mg/dL      >=20 ==================================================================== Declared Medications:  The flagging and interpretation on this report are based on the  following declared medications.  Unexpected results may arise from  inaccuracies in the declared medications.  **Note: The testing scope of this panel includes these medications:  Cyclobenzaprine  Diphenhydramine  Gabapentin  **Note: The testing scope of this panel does not include following  reported medications:  Albuterol  Amlodipine  Calcium carbonate  Cetirizine  Cholecalciferol  Fluconazole  Fluticasone  Multivitamin  Polyethylene Glycol  Sucralfate  Supplement (Primrose) ==================================================================== For clinical consultation, please call [phone]. ====================================================================      ROS  Constitutional: Denies any fever or chills Gastrointestinal: No reported hemesis, hematochezia, vomiting, or acute GI distress Musculoskeletal: Denies any acute onset joint swelling, redness, loss of ROM, or weakness Neurological: No reported episodes of acute onset apraxia, aphasia, dysarthria, agnosia, amnesia, paralysis, loss of coordination, or loss of consciousness  Medication Review  Evening Primrose Oil, Magnesium Oxide (Laxative), Melatonin, Probiotic Product, albuterol, aluminum chloride, amLODipine, calcium carbonate, cetirizine, cholecalciferol, diphenhydrAMINE, famotidine, fluticasone, gabapentin, multivitamin, nebivolol, polyethylene glycol, rosuvastatin, and sucralfate  History Review   Allergy: [name] is allergic to dicyclomine, minoxidil, triamcinolone acetonide, valacyclovir, amantadines, cymbalta [duloxetine hcl], erythromycin, flexeril [cyclobenzaprine], hydrochlorothiazide, iodine, meloxicam, nexium [esomeprazole magnesium], prednisone, prilosec [omeprazole], savella [milnacipran hcl], cleocin [clindamycin hcl], cleocin [clindamycin], fluocinolone acetonide, latex, and tramadol. Drug: [name]  reports no history of drug use. Alcohol:  reports no history of alcohol use. Tobacco:  reports that she has never smoked. She has never used smokeless tobacco. Social: [name]  reports that she has never smoked. She has never used smokeless tobacco. She reports that she does not drink alcohol and does not use drugs. Medical:  has a past medical history of Chronic constipation, Dysrhythmia, Fatty liver disease, nonalcoholic, Fibromyalgia, GERD (gastroesophageal reflux disease), History of hiatal hernia, and Hypertension. Surgical: [name]  has a past surgical history that includes Colonoscopy with propofol (N/A, 09/27/2015); Abdominal hysterectomy; Breast surgery; and Esophagogastroduodenoscopy (egd) with propofol (N/A, 10/29/2019). Family: family history is not on file.  Laboratory Chemistry Profile   Renal Lab Results  Component Value Date   BUN 18 07/21/2020   CREATININE 0.89 07/21/2020   BCR 20 07/21/2020   GFRAA 76 07/21/2020   GFRNONAA 66 07/21/2020     Hepatic Lab Results  Component Value Date   AST 22 07/21/2020   ALT 16 08/04/2019   ALBUMIN 4.2 07/21/2020   ALKPHOS 75 07/21/2020   LIPASE 31 08/04/2019     Electrolytes Lab Results  Component Value Date   NA 140 07/21/2020   K 4.7 07/21/2020   CL 104 07/21/2020   CALCIUM 9.4 07/21/2020   MG 2.0 07/21/2020     Bone Lab Results  Component Value Date   25OHVITD1 45 07/21/2020   25OHVITD2 1.1 07/21/2020   25OHVITD3 44 07/21/2020     Inflammation (CRP: Acute Phase) (ESR: Chronic Phase) Lab  Results  Component Value Date   CRP 8 07/21/2020   ESRSEDRATE 31 07/21/2020       Note: Above Lab results reviewed.  Recent Imaging Review  DG Hand Complete Right CLINICAL DATA:  Chronic right hand pain without known injury.  EXAM: RIGHT HAND - COMPLETE 3+ VIEW  COMPARISON:  None.  FINDINGS: There is no evidence of fracture or dislocation. There is no evidence of arthropathy or other focal bone abnormality. Soft tissues are unremarkable.  IMPRESSION: Negative.  Electronically Signed   By: Marijo Conception M.D.   On: 07/22/2020 09:30 DG Hand Complete Left CLINICAL DATA:  Chronic hand pain.  EXAM: LEFT HAND - COMPLETE 3+ VIEW  COMPARISON:  None.  FINDINGS: There is no evidence of fracture or dislocation. Moderate degenerative changes seen involving the distal interphalangeal joint of the index finger. Soft tissues are unremarkable.  IMPRESSION: Moderate osteoarthritis of the distal interphalangeal joint of the index finger. No acute abnormality seen in the left hand.  Electronically Signed   By: Marijo Conception M.D.   On: 07/22/2020 09:29 DG Foot Complete Right CLINICAL DATA:  Chronic right foot pain.  EXAM: RIGHT FOOT COMPLETE - 3+ VIEW  COMPARISON:  None.  FINDINGS: There is no evidence of fracture or dislocation. There is no evidence of arthropathy or other focal bone abnormality. Soft tissues are unremarkable.  IMPRESSION: Negative.  Electronically Signed   By: Marijo Conception M.D.   On: 07/22/2020 09:27 DG Foot Complete Left CLINICAL DATA:  Chronic left foot pain.  EXAM: LEFT FOOT - COMPLETE 3+ VIEW  COMPARISON:  None.  FINDINGS: There is no evidence of fracture or dislocation. There is no evidence of arthropathy or other focal bone abnormality. Soft tissues are unremarkable.  IMPRESSION: Negative.  Electronically Signed   By: Marijo Conception M.D.   On: 07/22/2020 09:25 Note: Reviewed        Physical Exam  General appearance:  Well nourished, well developed, and well hydrated. In no apparent acute distress Mental status: Alert, oriented x 3 (person, place, & time)       Respiratory: No evidence of acute respiratory distress Eyes: PERLA Vitals: BP 134/61 (BP Location: Left Arm, Patient Position: Sitting, Cuff Size: Normal)   Pulse 72   Temp 98.7 F (37.1 C) (Oral)   Resp 16   Ht 5' 1"  (1.549 m)   Wt 175 lb (79.4 kg)   SpO2 100%   BMI 33.07 kg/m  BMI: Estimated body mass index is 33.07 kg/m as calculated from the following:   Height as of this encounter: 5' 1"  (1.549 m).  Weight as of this encounter: 175 lb (79.4 kg). Ideal: Ideal body weight: 47.8 kg (105 lb 6.1 oz) Adjusted ideal body weight: 60.4 kg (133 lb 3.6 oz)  Assessment   Status Diagnosis  Controlled Controlled Controlled 1. Chronic pain syndrome   2. Chronic hand pain (Bilateral)   3. Chronic joint pain in hands (Bilateral)   4. 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 Chronic pain of toes of both feet (R>L)   6. Chronic low back pain (Primary Area of Pain) (Bilateral) (R>L) w/ sciatica (Bilateral)   7. Chronic lower extremity pain (Secondary Area of Pain) (Bilateral) (R>L)   8. Chronic neck pain (Tertiary Area of Pain) (Bilateral) (R>L)   9. 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  [name] has a  current medication list which includes the following long-term medication(s): albuterol, amlodipine, calcium carbonate, cetirizine, diphenhydramine, famotidine, fluticasone, gabapentin, melatonin, nebivolol, rosuvastatin, and sucralfate.  Pharmacotherapy (Medications Ordered): No orders of the defined types were placed in this encounter.  Orders:  No orders of the defined types were placed in this encounter.  Follow-up plan:   Return if symptoms worsen or fail to improve.      Considering:   NOTE: KENALOG (Use Decadron), IODINE, CONTRAST, and LATEX Allergy. Possible bilateral lumbar facet RFA Diagnostic right-sided sacroiliac joint block Possible right-sided sacroiliac joint RFA DiagnosticrightCESI Possible bilateral cervical facet RFA Diagnostic bilateral L4 TFESI Diagnostic right-sided L3-4 LESI Diagnostic TPI/MNB Diagnosticlidocaine infusion   Palliative PRN treatment(s):   Palliative/therapeutic left L4-5 LESI #3 (using Decadron) Diagnostic bilateral lumbar facet block #2 Diagnostic bilateral cervical facet nerve block #2     Recent Visits Date Type Provider Dept  07/21/20 Office Visit Milinda Pointer, MD [facility]  Showing recent visits within past 90 days and meeting all other requirements Today's Visits Date Type Provider Dept  08/05/20 Office Visit Milinda Pointer, MD [facility]  Showing today's visits and meeting all other requirements Future Appointments Date Type Provider Dept  10/27/20 Appointment Milinda Pointer, MD [facility]  Showing future appointments within next 90 days and meeting all other requirements  I discussed the assessment and treatment plan with the patient. The patient was provided an opportunity to ask questions and all were answered. The patient agreed with the plan and demonstrated an understanding of the instructions.  Patient advised to call back or seek an in-person evaluation if the  symptoms or condition worsens.  Duration of encounter: 36 minutes.  Note by: Gaspar Cola, MD Date: 08/05/2020; Time: 12:58 PM

## 2020-08-05 NOTE — Progress Notes (Signed)
Safety precautions to be maintained throughout the outpatient stay will include: orient to surroundings, keep bed in low position, maintain call bell within reach at all times, provide assistance with transfer out of bed and ambulation.  

## 2020-10-23 NOTE — Progress Notes (Unsigned)
Patient canceled.  Apparently she is not feeling well.

## 2020-10-27 ENCOUNTER — Encounter: Payer: Medicare Other | Admitting: Pain Medicine

## 2021-03-04 ENCOUNTER — Encounter: Payer: Self-pay | Admitting: *Deleted

## 2021-03-07 ENCOUNTER — Ambulatory Visit: Payer: Medicare Other | Admitting: Anesthesiology

## 2021-03-07 ENCOUNTER — Encounter
Admission: RE | Disposition: A | Discharge status: Home / Self Care | Payer: Self-pay | Source: Home / Self Care | Attending: Gastroenterology

## 2021-03-07 ENCOUNTER — Ambulatory Visit
Admission: RE | Admit: 2021-03-07 | Discharge: 2021-03-07 | Disposition: A | Discharge status: Home / Self Care | Payer: Medicare Other | Attending: Gastroenterology | Admitting: Gastroenterology

## 2021-03-07 DIAGNOSIS — Z91040 Latex allergy status: Secondary | ICD-10-CM | POA: Diagnosis not present

## 2021-03-07 DIAGNOSIS — I10 Essential (primary) hypertension: Secondary | ICD-10-CM | POA: Diagnosis not present

## 2021-03-07 DIAGNOSIS — Z888 Allergy status to other drugs, medicaments and biological substances status: Secondary | ICD-10-CM | POA: Diagnosis not present

## 2021-03-07 DIAGNOSIS — Z1211 Encounter for screening for malignant neoplasm of colon: Secondary | ICD-10-CM | POA: Insufficient documentation

## 2021-03-07 DIAGNOSIS — Z881 Allergy status to other antibiotic agents status: Secondary | ICD-10-CM | POA: Insufficient documentation

## 2021-03-07 DIAGNOSIS — Z86010 Personal history of colonic polyps: Secondary | ICD-10-CM | POA: Diagnosis not present

## 2021-03-07 DIAGNOSIS — K640 First degree hemorrhoids: Secondary | ICD-10-CM | POA: Insufficient documentation

## 2021-03-07 DIAGNOSIS — Z79899 Other long term (current) drug therapy: Secondary | ICD-10-CM | POA: Insufficient documentation

## 2021-03-07 DIAGNOSIS — M797 Fibromyalgia: Secondary | ICD-10-CM | POA: Insufficient documentation

## 2021-03-07 DIAGNOSIS — Z885 Allergy status to narcotic agent status: Secondary | ICD-10-CM | POA: Insufficient documentation

## 2021-03-07 HISTORY — DX: Benign neoplasm of colon, unspecified: D12.6

## 2021-03-07 HISTORY — PX: COLONOSCOPY WITH PROPOFOL: SHX5780

## 2021-03-07 SURGERY — COLONOSCOPY WITH PROPOFOL
Anesthesia: General

## 2021-03-07 MED ORDER — PROPOFOL 500 MG/50ML IV EMUL
INTRAVENOUS | Status: AC
  Filled 2021-03-07: qty 50

## 2021-03-07 MED ORDER — LIDOCAINE HCL (CARDIAC) PF 100 MG/5ML IV SOSY
PREFILLED_SYRINGE | INTRAVENOUS | Status: DC | PRN
  Administered 2021-03-07: 50 mg via INTRAVENOUS

## 2021-03-07 MED ORDER — SODIUM CHLORIDE 0.9 % IV SOLN
INTRAVENOUS | Status: DC
  Administered 2021-03-07: 20 mL/h via INTRAVENOUS

## 2021-03-07 MED ORDER — MIDAZOLAM HCL 2 MG/2ML IJ SOLN
INTRAMUSCULAR | Status: DC | PRN
  Administered 2021-03-07: 2 mg via INTRAVENOUS

## 2021-03-07 MED ORDER — FENTANYL CITRATE (PF) 100 MCG/2ML IJ SOLN
INTRAMUSCULAR | Status: DC | PRN
  Administered 2021-03-07 (×2): 50 ug via INTRAVENOUS

## 2021-03-07 MED ORDER — MIDAZOLAM HCL 2 MG/2ML IJ SOLN
INTRAMUSCULAR | Status: AC
  Filled 2021-03-07: qty 2

## 2021-03-07 MED ORDER — LIDOCAINE HCL (PF) 2 % IJ SOLN
INTRAMUSCULAR | Status: AC
  Filled 2021-03-07: qty 5

## 2021-03-07 MED ORDER — FENTANYL CITRATE (PF) 100 MCG/2ML IJ SOLN
INTRAMUSCULAR | Status: AC
  Filled 2021-03-07: qty 2

## 2021-03-07 MED ORDER — PROPOFOL 10 MG/ML IV BOLUS
INTRAVENOUS | Status: DC | PRN
  Administered 2021-03-07: 20 mg via INTRAVENOUS
  Administered 2021-03-07: 30 mg via INTRAVENOUS

## 2021-03-07 MED ORDER — PHENYLEPHRINE HCL (PRESSORS) 10 MG/ML IV SOLN
INTRAVENOUS | Status: AC
  Filled 2021-03-07: qty 1

## 2021-03-07 MED ORDER — PROPOFOL 500 MG/50ML IV EMUL
INTRAVENOUS | Status: DC | PRN
  Administered 2021-03-07: 50 ug/kg/min via INTRAVENOUS

## 2021-03-07 NOTE — Anesthesia Preprocedure Evaluation (Addendum)
Anesthesia Evaluation  Patient identified by MRN, date of birth, ID band Patient awake    Reviewed: Allergy & Precautions, NPO status , Patient's Chart, lab work & pertinent test results  Airway Mallampati: III  TM Distance: >3 FB Neck ROM: full    Dental  (+) Chipped   Pulmonary sleep apnea ,    Pulmonary exam normal        Cardiovascular hypertension, Normal cardiovascular exam+ dysrhythmias      Neuro/Psych  Neuromuscular disease negative psych ROS   GI/Hepatic Neg liver ROS, hiatal hernia, GERD  ,  Endo/Other  negative endocrine ROS  Renal/GU negative Renal ROS  negative genitourinary   Musculoskeletal  (+) Arthritis , Fibromyalgia -  Abdominal   Peds  Hematology negative hematology ROS (+)   Anesthesia Other Findings Past Medical History: No date: Chronic constipation No date: Dysrhythmia No date: Fatty liver disease, nonalcoholic No date: Fibromyalgia No date: GERD (gastroesophageal reflux disease) No date: History of hiatal hernia No date: Hypertension No date: Tubular adenoma of colon  Past Surgical History: No date: ABDOMINAL HYSTERECTOMY No date: BREAST SURGERY No date: C-SECTIONS 09/27/2015: COLONOSCOPY WITH PROPOFOL; N/A     Comment:  Procedure: COLONOSCOPY WITH PROPOFOL;  Surgeon: Hulen Luster, MD;  Location: [facility] END[facility]COPY;  Service:               Gastroenterology;  Laterality: N/A; 10/29/2019: ESOPHAGOGASTRODUODENOSCOPY (EGD) WITH PROPOFOL; N/A     Comment:  Procedure: ESOPHAGOGASTRODUODENOSCOPY (EGD) WITH               PROPOFOL;  Surgeon: Toledo, Benay Pike, MD;  Location:               [facility]COPY;  Service: Gastroenterology;  Laterality:               N/A;  BMI    Body Mass Index: 32.88 kg/m      Reproductive/Obstetrics negative OB ROS                            Anesthesia Physical Anesthesia Plan  ASA: 2  Anesthesia Plan: General    Post-op Pain Management:    Induction: Intravenous  PONV Risk Score and Plan: Propofol infusion and TIVA  Airway Management Planned: Natural Airway and Nasal Cannula  Additional Equipment:   Intra-op Plan:   Post-operative Plan:   Informed Consent: I have reviewed the patients History and Physical, chart, labs and discussed the procedure including the risks, benefits and alternatives for the proposed anesthesia with the patient or authorized representative who has indicated his/her understanding and acceptance.     Dental Advisory Given  Plan Discussed with: Anesthesiologist, CRNA and Surgeon  Anesthesia Plan Comments: (Patient consented for risks of anesthesia including but not limited to:  - adverse reactions to medications - risk of airway placement if required - damage to eyes, teeth, lips or other oral mucosa - nerve damage due to positioning  - sore throat or hoarseness - Damage to heart, brain, nerves, lungs, other parts of body or loss of life  Patient voiced understanding.)        Anesthesia Quick Evaluation

## 2021-03-07 NOTE — Transfer of Care (Signed)
Immediate Anesthesia Transfer of Care Note  Patient: Andrea Yang  Procedure(s) Performed: COLONOSCOPY WITH PROPOFOL  Patient Location: PACU  Anesthesia Type:General  Level of Consciousness: sedated  Airway & Oxygen Therapy: Patient Spontanous Breathing and Patient connected to nasal cannula oxygen  Post-op Assessment: Report given to RN and Post -op Vital signs reviewed and stable  Post vital signs: Reviewed and stable  Last Vitals:  Vitals Value Taken Time  BP 104/50 03/07/21 0806  Temp    Pulse 65 03/07/21 0808  Resp 11 03/07/21 0808  SpO2 97 % 03/07/21 0808  Vitals shown include unvalidated device data.  Last Pain:  Vitals:   03/07/21 0658  TempSrc: Temporal  PainSc: 5          Complications: No notable events documented.

## 2021-03-07 NOTE — Anesthesia Postprocedure Evaluation (Signed)
Anesthesia Post Note  Patient: Willey Blade  Procedure(s) Performed: COLONOSCOPY WITH PROPOFOL  Patient location during evaluation: Endoscopy Anesthesia Type: General Level of consciousness: awake and alert Pain management: pain level controlled Vital Signs Assessment: post-procedure vital signs reviewed and stable Respiratory status: spontaneous breathing, nonlabored ventilation, respiratory function stable and patient connected to nasal cannula oxygen Cardiovascular status: blood pressure returned to baseline and stable Postop Assessment: no apparent nausea or vomiting Anesthetic complications: no   No notable events documented.   Last Vitals:  Vitals:   03/07/21 0827 03/07/21 0828  BP:  (!) 124/55  Pulse: (!) 44 (!) 46  Resp: 13 13  Temp:    SpO2: 99% 98%    Last Pain:  Vitals:   03/07/21 0806  TempSrc: Temporal  PainSc:                  Margaree Mackintosh

## 2021-03-07 NOTE — Interval H&P Note (Signed)
History and Physical Interval Note:  03/07/2021 7:46 AM  Andrea Yang  has presented today for surgery, with the diagnosis of history of adenomatous polyp of colon.  The various methods of treatment have been discussed with the patient and family. After consideration of risks, benefits and other options for treatment, the patient has consented to  Procedure(s): COLONOSCOPY WITH PROPOFOL (N/A) as a surgical intervention.  The patient's history has been reviewed, patient examined, no change in status, stable for surgery.  I have reviewed the patient's chart and labs.  Questions were answered to the patient's satisfaction.     Lesly Rubenstein  Ok to proceed with colonoscopy

## 2021-03-07 NOTE — H&P (Signed)
Outpatient short stay form Pre-procedure 03/07/2021 7:44 AM Andrea Yang  Primary Physician: [name]  Reason for visit:  Surveillance colonoscopy  History of present illness:   70 y/o lady with history of hypertension and fibromyalgia here for surveillance colonoscopy. History of hysterectomy. No family history of GI malignancies. No blood thinners. History of constipation.    Current Facility-Administered Medications:    0.9 %  sodium chloride infusion, , Intravenous, Continuous, [name], [name], Yang, Last Rate: 20 mL/hr at 03/07/21 0714, 20 mL/hr at 03/07/21 0714  Medications Prior to Admission  Medication Sig Dispense Refill Last Dose   albuterol (PROVENTIL HFA;VENTOLIN HFA) 108 (90 Base) MCG/ACT inhaler Inhale 2 puffs into the lungs every 6 (six) hours as needed for wheezing or shortness of breath.   Past Week   aluminum chloride (DRYSOL) 20 % external solution Apply 20 each topically daily after supper.   Past Week   amLODipine (NORVASC) 2.5 MG tablet Take 2.5 mg by mouth daily.   Past Week   calcium carbonate (OSCAL) 1500 (600 Ca) MG TABS tablet Take by mouth 2 (two) times daily with a meal.   Past Week   cetirizine (ZYRTEC) 10 MG tablet Take 10 mg by mouth daily.   Past Week   cholecalciferol (VITAMIN D) 400 units TABS tablet Take 400 Units by mouth.   Past Week   diphenhydrAMINE (BENADRYL) 50 MG tablet Take 50 mg by mouth at bedtime as needed for itching.   Past Week   EVENING PRIMROSE OIL PO Take by mouth daily at 6 (six) AM.   Past Week   famotidine (PEPCID) 10 MG tablet Take 10 mg by mouth 2 (two) times daily.   Past Week   fluticasone (FLONASE) 50 MCG/ACT nasal spray Place 2 sprays into both nostrils daily.   Past Week   Magnesium Oxide 500 MG (LAX) TABS Take 500 mg by mouth at bedtime.   Past Week   Multiple Vitamin (MULTIVITAMIN) tablet Take 1 tablet by mouth daily.   Past Week   polyethylene glycol (MIRALAX / GLYCOLAX) packet Take 1 packet by mouth as  needed.   Past Week   Probiotic Product (PROBIOTIC-10 PO) Take 1 tablet by mouth daily.   Past Week   rosuvastatin (CRESTOR) 10 MG tablet Take 10 mg by mouth daily.   Past Week   sucralfate (CARAFATE) 1 g tablet Take 1 g by mouth daily.   Past Week   gabapentin (NEURONTIN) 300 MG capsule Take 1 capsule (300 mg total) by mouth 2 (two) times daily AND 2 capsules (600 mg total) at bedtime. 120 capsule 2    Melatonin 10 MG CAPS Take 10-20 mg by mouth at bedtime as needed. 60 capsule 2    metoprolol tartrate (LOPRESSOR) 25 MG tablet Take 12.5 mg by mouth daily. (Patient not taking: Reported on 03/07/2021)   Not Taking   nebivolol (BYSTOLIC) 5 MG tablet Take 2.5 mg by mouth daily. (Patient not taking: Reported on 03/07/2021)   Not Taking     Allergies  Allergen Reactions   Dicyclomine    Minoxidil Other (See Comments)   Triamcinolone Acetonide Palpitations   Valacyclovir Nausea Only   Amantadines Other (See Comments)    Stomach pain.    Cymbalta [Duloxetine Hcl] Other (See Comments)    Stomach Pain   Erythromycin Other (See Comments)    Stomach Ache   Flexeril [Cyclobenzaprine] Other (See Comments)   Hydrochlorothiazide     Leg Cramps   Iodine Hives  Welts and hives.    Meloxicam     Chest pain, burning in stomach   Methocarbamol Other (See Comments)   Nexium [Esomeprazole Magnesium] Swelling   Prednisone Other (See Comments)    LOSS OF CONCIOUSNESS    Prilosec [Omeprazole] Swelling    Tongue Swelling    Savella [Milnacipran Hcl]     Stomach Pain   Cleocin [Clindamycin Hcl] Rash   Cleocin [Clindamycin] Rash   Fluocinolone Acetonide Other (See Comments)   Latex Rash   Tramadol Rash    Rash and headache.      Past Medical History:  Diagnosis Date   Chronic constipation    Dysrhythmia    Fatty liver disease, nonalcoholic    Fibromyalgia    GERD (gastroesophageal reflux disease)    History of hiatal hernia    Hypertension    Tubular adenoma of colon     Review of  systems:  Otherwise negative.    Physical Exam  Gen: Alert, oriented. Appears stated age.  HEENT: PERRLA. Lungs: No respiratory distress CV: RRR Abd: soft, benign, no masses Ext: No edema    Planned procedures: Proceed with colonoscopy. The patient understands the nature of the planned procedure, indications, risks, alternatives and potential complications including but not limited to bleeding, infection, perforation, damage to internal organs and possible oversedation/side effects from anesthesia. The patient agrees and gives consent to proceed.  Please refer to procedure notes for findings, recommendations and patient disposition/instructions.     Andrea Yang [facility] 03/07/2021  7:44 AM

## 2021-03-07 NOTE — Op Note (Signed)
[facility] Gastroenterology Patient Name: Andrea Yang Procedure Date: 03/07/2021 7:18 AM MRN: [id number] Account #: [id number] Date of Birth: [birth year] Admit Type: Outpatient Age: 70 Room: [facility] ENDO ROOM 3 Gender: Female Note Status: Finalized Procedure:             Colonoscopy Indications:           Surveillance: Personal history of adenomatous polyps                         on last colonoscopy 5 years ago Providers:             Andrey Farmer MD, MD Referring MD:          Renee Rival (Referring MD) Medicines:             Monitored Anesthesia Care Complications:         No immediate complications. Procedure:             Pre-Anesthesia Assessment:                        - Prior to the procedure, a History and Physical was                         performed, and patient medications and allergies were                         reviewed. The patient is competent. The risks and                         benefits of the procedure and the sedation options and                         risks were discussed with the patient. All questions                         were answered and informed consent was obtained.                         Patient identification and proposed procedure were                         verified by the physician, the nurse, the anesthetist                         and the technician in the endoscopy suite. Mental                         Status Examination: alert and oriented. Airway                         Examination: normal oropharyngeal airway and neck                         mobility. Respiratory Examination: clear to                         auscultation. CV Examination: normal. Prophylactic  Antibiotics: The patient does not require prophylactic                         antibiotics. Prior Anticoagulants: The patient has                         taken no previous anticoagulant or antiplatelet                         agents.  ASA Grade Assessment: III - A patient with                         severe systemic disease. After reviewing the risks and                         benefits, the patient was deemed in satisfactory                         condition to undergo the procedure. The anesthesia                         plan was to use monitored anesthesia care (MAC).                         Immediately prior to administration of medications,                         the patient was re-assessed for adequacy to receive                         sedatives. The heart rate, respiratory rate, oxygen                         saturations, blood pressure, adequacy of pulmonary                         ventilation, and response to care were monitored                         throughout the procedure. The physical status of the                         patient was re-assessed after the procedure.                        After obtaining informed consent, the colonoscope was                         passed under direct vision. Throughout the procedure,                         the patient's blood pressure, pulse, and oxygen                         saturations were monitored continuously. The                         Colonoscope was introduced through the anus and  advanced to the the cecum, identified by appendiceal                         orifice and ileocecal valve. The colonoscopy was                         performed without difficulty. The patient tolerated                         the procedure well. The quality of the bowel                         preparation was good. Findings:      The perianal and digital rectal examinations were normal.      Internal hemorrhoids were found during retroflexion. The hemorrhoids       were Grade I (internal hemorrhoids that do not prolapse).      The exam was otherwise without abnormality on direct and retroflexion       views. Impression:            - Internal hemorrhoids.                         - The examination was otherwise normal on direct and                         retroflexion views.                        - No specimens collected. Recommendation:        - Discharge patient to home.                        - Resume previous diet.                        - Continue present medications.                        - Repeat colonoscopy in 10 years for surveillance.                        - Return to referring physician as previously                         scheduled. Procedure Code(s):     --- Professional ---                        X4481, Colorectal cancer screening; colonoscopy on                         individual at high risk Diagnosis Code(s):     --- Professional ---                        Z86.010, Personal history of colonic polyps                        K64.0, First degree hemorrhoids CPT copyright 2019 [employer]. All rights reserved. The codes documented in this report are preliminary and upon coder review may  be revised to meet current compliance requirements. Andrey Farmer MD, MD 03/07/2021 8:04:48 AM Number of Addenda: 0 Note Initiated On: 03/07/2021 7:18 AM Scope Withdrawal Time: 0 hours 5 minutes 16 seconds  Total Procedure Duration: 0 hours 10 minutes 24 seconds  Estimated Blood Loss:  Estimated blood loss: none.      [facility]

## 2021-03-08 ENCOUNTER — Encounter: Payer: Self-pay | Admitting: Gastroenterology

## 2021-07-13 ENCOUNTER — Other Ambulatory Visit: Payer: Self-pay | Admitting: Nurse Practitioner

## 2021-07-13 DIAGNOSIS — Z9289 Personal history of other medical treatment: Secondary | ICD-10-CM

## 2021-07-14 ENCOUNTER — Other Ambulatory Visit: Payer: Self-pay | Admitting: Nurse Practitioner

## 2021-07-14 DIAGNOSIS — Z13820 Encounter for screening for osteoporosis: Secondary | ICD-10-CM

## 2021-07-18 ENCOUNTER — Other Ambulatory Visit: Payer: Self-pay | Admitting: Nurse Practitioner

## 2021-07-18 DIAGNOSIS — Z90722 Acquired absence of ovaries, bilateral: Secondary | ICD-10-CM

## 2021-07-18 DIAGNOSIS — Z13820 Encounter for screening for osteoporosis: Secondary | ICD-10-CM

## 2021-09-22 ENCOUNTER — Other Ambulatory Visit: Payer: Self-pay

## 2021-09-22 ENCOUNTER — Emergency Department
Admission: EM | Admit: 2021-09-22 | Discharge: 2021-09-22 | Disposition: A | Discharge status: Home / Self Care | Payer: Medicare Other | Attending: Emergency Medicine | Admitting: Emergency Medicine

## 2021-09-22 DIAGNOSIS — R102 Pelvic and perineal pain: Secondary | ICD-10-CM | POA: Diagnosis not present

## 2021-09-22 DIAGNOSIS — I10 Essential (primary) hypertension: Secondary | ICD-10-CM | POA: Diagnosis not present

## 2021-09-22 DIAGNOSIS — M549 Dorsalgia, unspecified: Secondary | ICD-10-CM | POA: Diagnosis not present

## 2021-09-22 DIAGNOSIS — M797 Fibromyalgia: Secondary | ICD-10-CM | POA: Insufficient documentation

## 2021-09-22 DIAGNOSIS — E876 Hypokalemia: Secondary | ICD-10-CM | POA: Diagnosis not present

## 2021-09-22 DIAGNOSIS — K219 Gastro-esophageal reflux disease without esophagitis: Secondary | ICD-10-CM | POA: Diagnosis not present

## 2021-09-22 DIAGNOSIS — G8929 Other chronic pain: Secondary | ICD-10-CM | POA: Insufficient documentation

## 2021-09-22 DIAGNOSIS — R002 Palpitations: Secondary | ICD-10-CM | POA: Diagnosis present

## 2021-09-22 LAB — COMPREHENSIVE METABOLIC PANEL
ALT: 20 U/L (ref 0–44)
AST: 25 U/L (ref 15–41)
Albumin: 4.2 g/dL (ref 3.5–5.0)
Alkaline Phosphatase: 48 U/L (ref 38–126)
Anion gap: 7 (ref 5–15)
BUN: 18 mg/dL (ref 8–23)
CO2: 25 mmol/L (ref 22–32)
Calcium: 9.4 mg/dL (ref 8.9–10.3)
Chloride: 105 mmol/L (ref 98–111)
Creatinine, Ser: 0.78 mg/dL (ref 0.44–1.00)
GFR, Estimated: >60 mL/min (ref >60)
Glucose, Bld: 101 mg/dL — ABNORMAL HIGH (ref 70–99)
Potassium: 3.3 mmol/L — ABNORMAL LOW (ref 3.5–5.1)
Sodium: 137 mmol/L (ref 135–145)
Total Bilirubin: 0.4 mg/dL (ref 0.3–1.2)
Total Protein: 7.6 g/dL (ref 6.5–8.1)

## 2021-09-22 LAB — CBC
HCT: 36.3 % (ref 36.0–46.0)
Hemoglobin: 12.5 g/dL (ref 12.0–15.0)
MCH: 28.2 pg (ref 26.0–34.0)
MCHC: 34.4 g/dL (ref 30.0–36.0)
MCV: 81.8 fL (ref 80.0–100.0)
Platelets: 286 10*3/uL (ref 150–400)
RBC: 4.44 MIL/uL (ref 3.87–5.11)
RDW: 14.9 % (ref 11.5–15.5)
WBC: 7.8 10*3/uL (ref 4.0–10.5)
nRBC: 0 % (ref 0.0–0.2)

## 2021-09-22 LAB — URINALYSIS, ROUTINE W REFLEX MICROSCOPIC
Bilirubin Urine: NEGATIVE
Glucose, UA: NEGATIVE mg/dL
Hgb urine dipstick: NEGATIVE
Ketones, ur: NEGATIVE mg/dL
Leukocytes,Ua: NEGATIVE
Nitrite: NEGATIVE
Protein, ur: NEGATIVE mg/dL
Specific Gravity, Urine: 1.004 — ABNORMAL LOW (ref 1.005–1.030)
pH: 6 (ref 5.0–8.0)

## 2021-09-22 LAB — LIPASE, BLOOD: Lipase: 32 U/L (ref 11–51)

## 2021-09-22 MED ORDER — POTASSIUM CHLORIDE CRYS ER 20 MEQ PO TBCR
40.0000 meq | EXTENDED_RELEASE_TABLET | Freq: Once | ORAL | Status: AC
  Administered 2021-09-22: 40 meq via ORAL
  Filled 2021-09-22: qty 2

## 2021-09-22 MED ORDER — DROPERIDOL 2.5 MG/ML IJ SOLN
1.2500 mg | Freq: Once | INTRAMUSCULAR | Status: AC
  Administered 2021-09-22: 1.25 mg via INTRAVENOUS
  Filled 2021-09-22: qty 2

## 2021-09-22 NOTE — ED Notes (Signed)
Assessing for IV placement; pt hard stick.

## 2021-09-22 NOTE — ED Notes (Signed)
IV line flushes appropriately and blood pulls back into line but not enough for lab work. Will have Jacques [employer] or other RN try and involve phlebotomy if needed.

## 2021-09-22 NOTE — ED Provider Notes (Signed)
[facility] Provider Note    Event Date/Time   First MD Initiated Contact with Patient 09/22/21 1211     (approximate)   History   Abdominal Pain   HPI  Andrea Yang is a 71 y.o. female who presents to the ED for evaluation of Abdominal Pain   Review outpatient cardiology visit from 3 days ago.  She was seen for palpitations.  She has a history of obesity, HTN, atypical chest pains, NASH, GERD and fibromyalgia.  Patient presents to the ED for evaluation of chronic lower abdominal and back pain.  She reports chronic, bilateral, and really circumferential, pain throughout her lower pelvis, lower back bilaterally over the past few months, seems to be worsening over the past 2 weeks.  She reports her stools are orange-colored, then also reports her stools are dark green in color.  Adamantly denies any blood or melena.  Physical Exam   Triage Vital Signs: ED Triage Vitals  Enc Vitals Group     BP 09/22/21 1204 138/61     Pulse Rate 09/22/21 1204 (!) 58     Resp 09/22/21 1203 18     Temp 09/22/21 1203 97.9 F (36.6 C)     Temp src --      SpO2 09/22/21 1204 100 %     Weight 09/22/21 1206 178 lb (80.7 kg)     Height 09/22/21 1206 5\' 1"  (1.549 m)     Head Circumference --      Peak Flow --      Pain Score 09/22/21 1206 7     Pain Loc --      Pain Edu? --      Excl. in [place]? --     Most recent vital signs: Vitals:   09/22/21 1359 09/22/21 1400  BP:  (!) 134/55  Pulse: (!) 56   Resp:    Temp:    SpO2: 100%     General: Awake, no distress.  Obese.  Pleasant and conversational in full sentences. CV:  Good peripheral perfusion. RRR Resp:  Normal effort.  Abd:  No distention.  Soft and nontender throughout MSK:  No deformity noted.  No signs of trauma to the lower back, no skin changes and no tenderness to palpation.  No midline tenderness or spinal step-off throughout. Neuro:  No focal deficits appreciated. Other:     ED Results / Procedures /  Treatments   Labs (all labs ordered are listed, but only abnormal results are displayed) Labs Reviewed  COMPREHENSIVE METABOLIC PANEL - Abnormal; Notable for the following components:      Result Value   Potassium 3.3 (*)    Glucose, Bld 101 (*)    All other components within normal limits  URINALYSIS, ROUTINE W REFLEX MICROSCOPIC - Abnormal; Notable for the following components:   Color, Urine COLORLESS (*)    APPearance CLEAR (*)    Specific Gravity, Urine 1.004 (*)    All other components within normal limits  LIPASE, BLOOD  CBC    EKG Sinus rhythm, rate of 64 bpm.  Normal axis and intervals.  Nonspecific ST changes to leads III and aVF.  No STEMI  RADIOLOGY   Official radiology report(s): No results found.  PROCEDURES and INTERVENTIONS:  Procedures  Medications  droperidol (INAPSINE) 2.5 MG/ML injection 1.25 mg (1.25 mg Intravenous Given 09/22/21 1314)  potassium chloride SA (KLOR-CON M) CR tablet 40 mEq (40 mEq Oral Given 09/22/21 1354)     IMPRESSION / MDM /  ASSESSMENT AND PLAN / ED COURSE  I reviewed the triage vital signs and the nursing notes.  71 year old female with fibromyalgia and chronic pain presents to the ED with acute worsening of this pain, without evidence of acute pathology and suitable for outpatient management.  She has normal vitals and looks clinically well to me.  Nontender and benign abdomen, no signs of trauma throughout her flanks, back and areas of pain.  Blood work with mild hypokalemia that was repleted orally.  Urine is noninfectious and CBC is normal.  Lipase normal.  Considering her quite prolonged symptoms, no changes to function, nontender on examination I see no indications for CT imaging at this time.  We discussed management at home and appropriate return precautions for the ED.  Patient suitable for outpatient management with PCP follow-up.  Clinical Course as of 09/22/21 1823  Thu Sep 22, 2021  1346 Reassessed.  Feeling a little bit  better.  We discussed mild hypokalemia, but otherwise reassuring blood work and urinalysis.  I reexamined her and she continues to have soft and benign abdomen.  Shared decision making we will plan to pursue outpatient management PCP follow-up, with ED return precautions. [DS]    Clinical Course User Index [DS] Vladimir Crofts, MD     FINAL CLINICAL IMPRESSION(S) / ED DIAGNOSES   Final diagnoses:  Hypokalemia  Chronic pelvic pain in female     Rx / DC Orders   ED Discharge Orders     None        Note:  This document was prepared using Dragon voice recognition software and may include unintentional dictation errors.   Vladimir Crofts, MD 09/22/21 [phone]

## 2021-09-22 NOTE — ED Triage Notes (Signed)
Pt to ED for lower back and pelvic pain for a few weeks. Pain progressed to upper abd pain. Reports stools have been an orange color, denies that it looks like blood in stool. NAD noted.

## 2021-09-22 NOTE — ED Notes (Signed)
Pt c/o foot/ankle swelling; lower back pain; pelvic pain; LUQ pain; orange stool; HA. Pt denies SOB and CP. Pt in NAD.

## 2021-09-22 NOTE — ED Notes (Signed)
Pt given apple sauce with pills with verbal okay from EDP [name] as pt reports has hard time getting larger pills down without food. HOB adjusted for pt.

## 2021-09-30 ENCOUNTER — Other Ambulatory Visit: Payer: Self-pay | Admitting: Gastroenterology

## 2021-09-30 DIAGNOSIS — R1031 Right lower quadrant pain: Secondary | ICD-10-CM

## 2021-10-03 ENCOUNTER — Ambulatory Visit
Admission: RE | Admit: 2021-10-03 | Discharge: 2021-10-03 | Disposition: A | Discharge status: Home / Self Care | Payer: Medicare Other | Source: Ambulatory Visit | Attending: Nurse Practitioner | Admitting: Nurse Practitioner

## 2021-10-03 ENCOUNTER — Other Ambulatory Visit: Payer: Self-pay

## 2021-10-03 DIAGNOSIS — Z90722 Acquired absence of ovaries, bilateral: Secondary | ICD-10-CM | POA: Diagnosis not present

## 2021-10-04 ENCOUNTER — Other Ambulatory Visit: Payer: Self-pay

## 2021-10-04 ENCOUNTER — Ambulatory Visit
Admission: RE | Admit: 2021-10-04 | Discharge: 2021-10-04 | Disposition: A | Discharge status: Home / Self Care | Payer: Medicare Other | Source: Ambulatory Visit | Attending: Gastroenterology | Admitting: Gastroenterology

## 2021-10-04 DIAGNOSIS — R1032 Left lower quadrant pain: Secondary | ICD-10-CM | POA: Insufficient documentation

## 2021-10-04 DIAGNOSIS — R1031 Right lower quadrant pain: Secondary | ICD-10-CM | POA: Insufficient documentation

## 2024-08-01 ENCOUNTER — Other Ambulatory Visit: Payer: Self-pay | Admitting: Gastroenterology

## 2024-08-01 DIAGNOSIS — G8929 Other chronic pain: Secondary | ICD-10-CM

## 2024-08-01 DIAGNOSIS — K581 Irritable bowel syndrome with constipation: Secondary | ICD-10-CM
